# Patient Record
Sex: Female | Born: 1955
Health system: Southern US, Community
[De-identification: ages and names within clinical notes are randomized; demographics above are authoritative.]

## PROBLEM LIST (undated history)

## (undated) DIAGNOSIS — E785 Hyperlipidemia, unspecified: Secondary | ICD-10-CM

## (undated) DIAGNOSIS — K219 Gastro-esophageal reflux disease without esophagitis: Secondary | ICD-10-CM

## (undated) DIAGNOSIS — G5603 Carpal tunnel syndrome, bilateral upper limbs: Secondary | ICD-10-CM

## (undated) DIAGNOSIS — M1990 Unspecified osteoarthritis, unspecified site: Secondary | ICD-10-CM

## (undated) DIAGNOSIS — Z98811 Dental restoration status: Secondary | ICD-10-CM

## (undated) HISTORY — PX: TONSILLECTOMY AND ADENOIDECTOMY: SHX28

## (undated) HISTORY — DX: Gastro-esophageal reflux disease without esophagitis: K21.9

## (undated) HISTORY — DX: Hyperlipidemia, unspecified: E78.5

## (undated) HISTORY — DX: Unspecified osteoarthritis, unspecified site: M19.90

---

## 1995-11-13 HISTORY — PX: TUBAL LIGATION: SHX77

## 1999-10-09 ENCOUNTER — Other Ambulatory Visit: Admission: RE | Admit: 1999-10-09 | Discharge: 1999-10-09 | Payer: Self-pay | Admitting: *Deleted

## 2000-03-28 ENCOUNTER — Ambulatory Visit (HOSPITAL_COMMUNITY): Admission: RE | Admit: 2000-03-28 | Discharge: 2000-03-28 | Payer: Self-pay | Admitting: Gastroenterology

## 2001-03-20 ENCOUNTER — Other Ambulatory Visit: Admission: RE | Admit: 2001-03-20 | Discharge: 2001-03-20 | Payer: Self-pay | Admitting: Family Medicine

## 2001-07-26 ENCOUNTER — Emergency Department (HOSPITAL_COMMUNITY): Admission: EM | Admit: 2001-07-26 | Discharge: 2001-07-26 | Payer: Self-pay | Admitting: Emergency Medicine

## 2002-04-23 ENCOUNTER — Other Ambulatory Visit: Admission: RE | Admit: 2002-04-23 | Discharge: 2002-04-23 | Payer: Self-pay | Admitting: Obstetrics and Gynecology

## 2002-10-02 ENCOUNTER — Encounter: Payer: Self-pay | Admitting: Family Medicine

## 2002-10-02 ENCOUNTER — Encounter: Admission: RE | Admit: 2002-10-02 | Discharge: 2002-10-02 | Payer: Self-pay | Admitting: Family Medicine

## 2003-07-16 ENCOUNTER — Encounter: Admission: RE | Admit: 2003-07-16 | Discharge: 2003-07-16 | Payer: Self-pay | Admitting: Family Medicine

## 2003-07-16 ENCOUNTER — Encounter: Payer: Self-pay | Admitting: Family Medicine

## 2004-02-02 ENCOUNTER — Other Ambulatory Visit: Admission: RE | Admit: 2004-02-02 | Discharge: 2004-02-02 | Payer: Self-pay | Admitting: Obstetrics and Gynecology

## 2004-12-29 ENCOUNTER — Other Ambulatory Visit: Admission: RE | Admit: 2004-12-29 | Discharge: 2004-12-29 | Payer: Self-pay | Admitting: Obstetrics and Gynecology

## 2006-01-11 ENCOUNTER — Other Ambulatory Visit: Admission: RE | Admit: 2006-01-11 | Discharge: 2006-01-11 | Payer: Self-pay | Admitting: Obstetrics and Gynecology

## 2008-07-01 ENCOUNTER — Encounter: Admission: RE | Admit: 2008-07-01 | Discharge: 2008-07-01 | Payer: Self-pay | Admitting: Sports Medicine

## 2008-08-11 ENCOUNTER — Encounter: Admission: RE | Admit: 2008-08-11 | Discharge: 2008-08-11 | Payer: Self-pay | Admitting: Sports Medicine

## 2008-09-23 ENCOUNTER — Ambulatory Visit: Payer: Self-pay | Admitting: Internal Medicine

## 2008-10-21 ENCOUNTER — Encounter: Admission: RE | Admit: 2008-10-21 | Discharge: 2008-10-21 | Payer: Self-pay | Admitting: Sports Medicine

## 2008-12-03 ENCOUNTER — Ambulatory Visit: Payer: Self-pay | Admitting: Internal Medicine

## 2009-05-30 ENCOUNTER — Encounter: Admission: RE | Admit: 2009-05-30 | Discharge: 2009-05-30 | Payer: Self-pay | Admitting: Sports Medicine

## 2009-07-11 ENCOUNTER — Ambulatory Visit: Payer: Self-pay | Admitting: Internal Medicine

## 2009-07-19 ENCOUNTER — Ambulatory Visit: Payer: Self-pay | Admitting: Internal Medicine

## 2009-09-29 ENCOUNTER — Ambulatory Visit: Payer: Self-pay | Admitting: Internal Medicine

## 2009-12-20 ENCOUNTER — Ambulatory Visit: Payer: Self-pay | Admitting: Internal Medicine

## 2010-03-02 ENCOUNTER — Ambulatory Visit: Payer: Self-pay | Admitting: Family Medicine

## 2010-03-02 ENCOUNTER — Other Ambulatory Visit: Admission: RE | Admit: 2010-03-02 | Discharge: 2010-03-02 | Payer: Self-pay | Admitting: Family Medicine

## 2010-03-02 DIAGNOSIS — E785 Hyperlipidemia, unspecified: Secondary | ICD-10-CM | POA: Insufficient documentation

## 2010-03-02 DIAGNOSIS — R51 Headache: Secondary | ICD-10-CM | POA: Insufficient documentation

## 2010-03-02 DIAGNOSIS — R519 Headache, unspecified: Secondary | ICD-10-CM | POA: Insufficient documentation

## 2010-03-02 DIAGNOSIS — F17200 Nicotine dependence, unspecified, uncomplicated: Secondary | ICD-10-CM | POA: Insufficient documentation

## 2010-03-02 LAB — CONVERTED CEMR LAB
Ketones, urine, test strip: NEGATIVE
Nitrite: NEGATIVE
Specific Gravity, Urine: <1.005
WBC Urine, dipstick: NEGATIVE

## 2010-03-02 LAB — HM PAP SMEAR

## 2010-03-06 LAB — CONVERTED CEMR LAB
ALT: 12 units/L (ref 0–35)
AST: 18 units/L (ref 0–37)
Albumin: 4.3 g/dL (ref 3.5–5.2)
Basophils Absolute: 0 10*3/uL (ref 0.0–0.1)
Basophils Relative: 0.3 % (ref 0.0–3.0)
Eosinophils Relative: 2.3 % (ref 0.0–5.0)
GFR calc non Af Amer: 80.09 mL/min (ref >60)
Glucose, Bld: 80 mg/dL (ref 70–99)
HCT: 40.6 % (ref 36.0–46.0)
HDL: 61.8 mg/dL (ref >39.00)
Hemoglobin: 13.7 g/dL (ref 12.0–15.0)
Lymphs Abs: 2.4 10*3/uL (ref 0.7–4.0)
Monocytes Relative: 4.6 % (ref 3.0–12.0)
Neutro Abs: 6.9 10*3/uL (ref 1.4–7.7)
Potassium: 3.4 meq/L — ABNORMAL LOW (ref 3.5–5.1)
RBC: 4.42 M/uL (ref 3.87–5.11)
RDW: 13 % (ref 11.5–14.6)
Sodium: 143 meq/L (ref 135–145)
TSH: 0.8 microintl units/mL (ref 0.35–5.50)
Total Protein: 6.7 g/dL (ref 6.0–8.3)

## 2010-03-17 ENCOUNTER — Ambulatory Visit: Payer: Self-pay | Admitting: Family Medicine

## 2010-03-17 DIAGNOSIS — J309 Allergic rhinitis, unspecified: Secondary | ICD-10-CM | POA: Insufficient documentation

## 2010-03-30 ENCOUNTER — Ambulatory Visit: Payer: Self-pay | Admitting: Family Medicine

## 2010-07-31 ENCOUNTER — Telehealth: Payer: Self-pay | Admitting: Family Medicine

## 2010-11-12 HISTORY — PX: NASAL SEPTUM SURGERY: SHX37

## 2010-12-03 ENCOUNTER — Encounter: Payer: Self-pay | Admitting: Sports Medicine

## 2010-12-14 NOTE — Assessment & Plan Note (Signed)
Summary: BRAND NEW PT/TO EST/CJR   Vital Signs:  Patient profile:   55 year old female Menstrual status:  perimenopausal Height:      62 inches Weight:      136 pounds BMI:     24.96 Temp:     98.1 degrees F oral BP sitting:   110 / 70  (left arm) Cuff size:   regular  Vitals Entered By: Kern Reap CMA Duncan Dull) (March 02, 2010 10:43 AM) CC: new to establish Is Patient Diabetic? No Pain Assessment Patient in pain? no          Menstrual Status perimenopausal   CC:  new to establish.  History of Present Illness: Paige Wilson is a 55 year old, widowed female, smoker, G2, P38, with a 48 year old and 58 year old daughter, who is a Sales executive at the [employer] health department who comes in today as a new patient for a physical exam.  Her husband died a year and a half ago from complications of lung cancer.  He was a smoker.  She continues to smoke a pack of cigarettes a day.  She tried the chantix program, but only for 6 weeks.  Jomarie Longs is a history of hyperacidity, for which he takes protonic 20 mg daily.  She also is allergic rhinitis, for which she takes Zyrtec 10 mg nightly  She has mild sleep dysfunction.  She takes Klonopin one fourth of the 0.5-mg tablet nightly.  LMP September 2010.  She's had a BTL.  Review of systems she wears contacts for distance vision, she gets regular dental care, has a history of hyperlipidemia was put on Lipitor as she stopped the medication because of musculoskeletal side effects.  She's had a colonoscopy, which was normal two children as noted above.  Rest review of systems negative.  Family history unknown.  She was adopted.  Tetanus 2009.  Preventive Screening-Counseling & Management  Alcohol-Tobacco     Smoking Status: current     Packs/Day: 1.0      Drug Use:  no.    Allergies (verified): 1)  ! Bactrim 2)  ! Sulfa 3)  ! * Dust 4)  ! * Mold  Past History:  Past medical, surgical, family and social histories (including risk  factors) reviewed, and no changes noted (except as noted below).  Past Medical History: Headache arthritis Hyperlipidemia  Family History: Reviewed history and no changes required. Father: unkown adopted Mother:  Siblings:   Social History: Reviewed history and no changes required. Occupation: health department Single Current Smoker Alcohol use-no Drug use-no Packs/Day:  1.0 Smoking Status:  current Drug Use:  no  Review of Systems      See HPI  Physical Exam  General:  Well-developed,well-nourished,in no acute distress; alert,appropriate and cooperative throughout examination Head:  Normocephalic and atraumatic without obvious abnormalities. No apparent alopecia or balding. Eyes:  No corneal or conjunctival inflammation noted. EOMI. Perrla. Funduscopic exam benign, without hemorrhages, exudates or papilledema. Vision grossly normal. Ears:  External ear exam shows no significant lesions or deformities.  Otoscopic examination reveals clear canals, tympanic membranes are intact bilaterally without bulging, retraction, inflammation or discharge. Hearing is grossly normal bilaterally. Nose:  External nasal examination shows no deformity or inflammation. Nasal mucosa are pink and moist without lesions or exudates. Mouth:  Oral mucosa and oropharynx without lesions or exudates.  Teeth in good repair. Neck:  No deformities, masses, or tenderness noted. Chest Wall:  No deformities, masses, or tenderness noted. Breasts:  No mass, nodules, thickening, tenderness, bulging,  retraction, inflamation, nipple discharge or skin changes noted.   Lungs:  Normal respiratory effort, chest expands symmetrically. Lungs are clear to auscultation, no crackles or wheezes. Heart:  Normal rate and regular rhythm. S1 and S2 normal without gallop, murmur, click, rub or other extra sounds. Abdomen:  Bowel sounds positive,abdomen soft and non-tender without masses, organomegaly or hernias noted. Rectal:  No  external abnormalities noted. Normal sphincter tone. No rectal masses or tenderness. Genitalia:  Pelvic Exam:        External: normal female genitalia without lesions or masses        Vagina: normal without lesions or masses        Cervix: normal without lesions or masses        Adnexa: normal bimanual exam without masses or fullness        Uterus: normal by palpation        Pap smear: performed Msk:  No deformity or scoliosis noted of thoracic or lumbar spine.   Pulses:  R and L carotid,radial,femoral,dorsalis pedis and posterior tibial pulses are full and equal bilaterally Extremities:  No clubbing, cyanosis, edema, or deformity noted with normal full range of motion of all joints.   Neurologic:  No cranial nerve deficits noted. Station and gait are normal. Plantar reflexes are down-going bilaterally. DTRs are symmetrical throughout. Sensory, motor and coordinative functions appear intact. Skin:  Intact without suspicious lesions or rashes Cervical Nodes:  No lymphadenopathy noted Axillary Nodes:  No palpable lymphadenopathy Inguinal Nodes:  No significant adenopathy Psych:  Cognition and judgment appear intact. Alert and cooperative with normal attention span and concentration. No apparent delusions, illusions, hallucinations   Problems:  Medical Problems Added: 1)  Dx of Preventive Health Care  (ICD-V70.0) 2)  Dx of Tobacco Use  (ICD-305.1) 3)  Dx of Hyperlipidemia  (ICD-272.4) 4)  Dx of Headache  (ICD-784.0)  Impression & Recommendations:  Problem # 1:  TOBACCO USE (ICD-305.1) Assessment New  Her updated medication list for this problem includes:    Chantix Starting Month Pak 0.5 Mg X 11 & 1 Mg X 42 Tabs (Varenicline tartrate) ..... Uad    Chantix Continuing Month Pak 1 Mg Tabs (Varenicline tartrate) ..... Uad  Orders: Venipuncture (16109) TLB-Lipid Panel (80061-LIPID) TLB-BMP (Basic Metabolic Panel-BMET) (80048-METABOL) TLB-CBC Platelet - w/Differential  (85025-CBCD) TLB-Hepatic/Liver Function Pnl (80076-HEPATIC) TLB-TSH (Thyroid Stimulating Hormone) (84443-TSH) Tobacco use cessation intermediate 3-10 minutes (99406) UA Dipstick w/o Micro (automated)  (81003)  Problem # 2:  HYPERLIPIDEMIA (ICD-272.4) Assessment: New  Orders: Venipuncture (60454) TLB-Lipid Panel (80061-LIPID) TLB-BMP (Basic Metabolic Panel-BMET) (80048-METABOL) TLB-CBC Platelet - w/Differential (85025-CBCD) TLB-Hepatic/Liver Function Pnl (80076-HEPATIC) TLB-TSH (Thyroid Stimulating Hormone) (84443-TSH) Tobacco use cessation intermediate 3-10 minutes (99406) UA Dipstick w/o Micro (automated)  (81003) EKG w/ Interpretation (93000)  Problem # 3:  Preventive Health Care (ICD-V70.0) Assessment: New  Orders: Venipuncture (09811) TLB-Lipid Panel (80061-LIPID) TLB-BMP (Basic Metabolic Panel-BMET) (80048-METABOL) TLB-CBC Platelet - w/Differential (85025-CBCD) TLB-Hepatic/Liver Function Pnl (80076-HEPATIC) TLB-TSH (Thyroid Stimulating Hormone) (84443-TSH) Tobacco use cessation intermediate 3-10 minutes (99406) UA Dipstick w/o Micro (automated)  (81003) EKG w/ Interpretation (93000)  Complete Medication List: 1)  Vitamin D 1000 Unit Tabs (Cholecalciferol) .... Once daily 2)  Calcium 1500 Mg Tabs (Calcium carbonate) .... Once daily 3)  Protonix 40 Mg Tbec (Pantoprazole sodium) .... Take one tab by mouth once daily 4)  Zyrtec Hives Relief 10 Mg Tabs (Cetirizine hcl) .... Once daily 5)  Klonopin 0.5 Mg Tabs (Clonazepam) .... Take one tab by mouth once daily 6)  Chantix  Starting Month Pak 0.5 Mg X 11 & 1 Mg X 42 Tabs (Varenicline tartrate) .... Uad 7)  Chantix Continuing Month Pak 1 Mg Tabs (Varenicline tartrate) .... Uad  Patient Instructions: 1)  began  the  chantix program to return in one month for follow-up. 2)  Please schedule a follow-up appointment in 1 year. 3)  Schedule your mammogram. 4)  Take an Aspirin every day. Prescriptions: KLONOPIN 0.5 MG TABS  (CLONAZEPAM) take one tab by mouth once daily  #30 x 3   Entered and Authorized by:   Roderick Pee MD   Signed by:   Roderick Pee MD on 03/02/2010   Method used:   Print then Give to Patient   RxID:   [id number] PROTONIX 40 MG TBEC (PANTOPRAZOLE SODIUM) take one tab by mouth once daily  #100 x 3   Entered and Authorized by:   Roderick Pee MD   Signed by:   Roderick Pee MD on 03/02/2010   Method used:   Print then Give to Patient   RxID:   [id number] CHANTIX CONTINUING MONTH PAK 1 MG TABS (VARENICLINE TARTRATE) UAD  #1 x 3   Entered and Authorized by:   Roderick Pee MD   Signed by:   Roderick Pee MD on 03/02/2010   Method used:   Print then Give to Patient   RxID:   [id number] CHANTIX STARTING MONTH PAK 0.5 MG X 11 & 1 MG X 42 TABS (VARENICLINE TARTRATE) UAD  #1 x 0   Entered and Authorized by:   Roderick Pee MD   Signed by:   Roderick Pee MD on 03/02/2010   Method used:   Print then Give to Patient   RxID:   [id number]   Laboratory Results   Urine Tests  Date/Time Received: March 02, 2010   Routine Urinalysis   Color: yellow Appearance: Clear Glucose: negative   (Normal Range: Negative) Bilirubin: negative   (Normal Range: Negative) Ketone: negative   (Normal Range: Negative) Spec. Gravity: <1.005   (Normal Range: 1.003-1.035) Blood: small   (Normal Range: Negative) pH: 6.5   (Normal Range: 5.0-8.0) Protein: negative   (Normal Range: Negative) Urobilinogen: 0.2   (Normal Range: 0-1) Nitrite: negative   (Normal Range: Negative) Leukocyte Esterace: negative   (Normal Range: Negative)    Comments: Kern Reap CMA Duncan Dull)  March 02, 2010 11:47 AM

## 2010-12-14 NOTE — Assessment & Plan Note (Signed)
Summary: ? ear infection?/dm   Vital Signs:  Patient profile:   55 year old female Menstrual status:  perimenopausal Weight:      136 pounds Temp:     98.3 degrees F oral BP sitting:   118 / 80  (left arm) CC: Sinus pain in L ear, pressure   CC:  Sinus pain in L ear and pressure.  History of Present Illness: Paige Wilson is a 55 year old, widowed female, smoker, who comes in today for evaluation of head congestion, postnasal drip nonproductive cough, and a left ear ache.  We saw her a while back for general physical exam.  At that time was put on a smoking cessation program with chantix.  She has not purchased the medication.  Review of systems negative  Allergies: 1)  ! Bactrim 2)  ! Sulfa 3)  ! * Dust 4)  ! * Mold  Past History:  Past medical, surgical, family and social histories (including risk factors) reviewed for relevance to current acute and chronic problems.  Past Medical History: Reviewed history from 03/02/2010 and no changes required. Headache arthritis Hyperlipidemia  Family History: Reviewed history from 03/02/2010 and no changes required. Father: unkown adopted Mother:  Siblings:   Social History: Reviewed history from 03/02/2010 and no changes required. Occupation: health department Single Current Smoker Alcohol use-no Drug use-no  Review of Systems      See HPI  Physical Exam  General:  Well-developed,well-nourished,in no acute distress; alert,appropriate and cooperative throughout examination Head:  Normocephalic and atraumatic without obvious abnormalities. No apparent alopecia or balding. Eyes:  No corneal or conjunctival inflammation noted. EOMI. Perrla. Funduscopic exam benign, without hemorrhages, exudates or papilledema. Vision grossly normal. Ears:  External ear exam shows no significant lesions or deformities.  Otoscopic examination reveals clear canals, tympanic membranes are intact bilaterally without bulging, retraction, inflammation  or discharge. Hearing is grossly normal bilaterally. Nose:  External nasal examination shows no deformity or inflammation. Nasal mucosa are pink and moist without lesions or exudates. Mouth:  Oral mucosa and oropharynx without lesions or exudates.  Teeth in good repair. Neck:  No deformities, masses, or tenderness noted. Chest Wall:  No deformities, masses, or tenderness noted. Lungs:  Normal respiratory effort, chest expands symmetrically. Lungs are clear to auscultation, no crackles or wheezes.   Problems:  Medical Problems Added: 1)  Dx of Rhinitis  (ICD-477.9)  Impression & Recommendations:  Problem # 1:  TOBACCO USE (ICD-305.1) Assessment Unchanged  Her updated medication list for this problem includes:    Chantix Starting Month Pak 0.5 Mg X 11 & 1 Mg X 42 Tabs (Varenicline tartrate) ..... Uad    Chantix Continuing Month Pak 1 Mg Tabs (Varenicline tartrate) ..... Uad  Orders: Tobacco use cessation intermediate 3-10 minutes (16109)  Problem # 2:  RHINITIS (ICD-477.9) Assessment: New  Her updated medication list for this problem includes:    Zyrtec Hives Relief 10 Mg Tabs (Cetirizine hcl) ..... Once daily    Flonase 50 Mcg/act Susp (Fluticasone propionate) ..... Uad  Orders: Tobacco use cessation intermediate 3-10 minutes (99406)  Complete Medication List: 1)  Vitamin D 1000 Unit Tabs (Cholecalciferol) .... Once daily 2)  Calcium 1500 Mg Tabs (Calcium carbonate) .... Once daily 3)  Protonix 40 Mg Tbec (Pantoprazole sodium) .... Take one tab by mouth once daily 4)  Zyrtec Hives Relief 10 Mg Tabs (Cetirizine hcl) .... Once daily 5)  Klonopin 0.5 Mg Tabs (Clonazepam) .... Take one tab by mouth once daily 6)  Chantix Starting Month  Pak 0.5 Mg X 11 & 1 Mg X 42 Tabs (Varenicline tartrate) .... Uad 7)  Chantix Continuing Month Pak 1 Mg Tabs (Varenicline tartrate) .... Uad 8)  Flonase 50 Mcg/act Susp (Fluticasone propionate) .... Uad  Patient Instructions: 1)  begin the  chantix and stop smoking completely. 2)  At bedtime use afrin nasal spray,,,,,,,, followed by the saline nasal spray,,,,,,,,, followed by the steroid nasal spray.  After 5 nights stop the Afrin, but continue the saline nasal spray in the steroid nasal spray. 3)  Take 10 mg of plain  Zyrtec at bedtime.  Return p.r.n. Prescriptions: FLONASE 50 MCG/ACT SUSP (FLUTICASONE PROPIONATE) UAD  #1 x 5   Entered and Authorized by:   Roderick Pee MD   Signed by:   Roderick Pee MD on 03/17/2010   Method used:   Print then Give to Patient   RxID:   [id number]

## 2010-12-14 NOTE — Progress Notes (Signed)
Summary: Pt req new script for Klonopin. Pls call in to [facility]  Phone Note Refill Request Call back at [phone] cell   Refills Requested: Medication #1:  KLONOPIN 0.5 MG TABS take one tab by mouth once daily   Dosage confirmed as above?Dosage Confirmed Pt has misplaced written script for Klonopin. Pt is req this be called in to [facility] on [address]   Method Requested: Telephone to Pharmacy Initial call taken by: Lucy Antigua,  July 31, 2010 11:00 AM  Follow-up for Phone Call        Last refill in our system was 03-02-10 for 30 tabs with 3 refills  Last office visit was 03-30-10 Follow-up by: Kathrynn Speed CMA,  July 31, 2010 11:42 AM  Additional Follow-up for Phone Call Additional follow up Details #1::        Klonopin .5 mg, dispensed 30 tabs directions one nightly p.r.n. refills x 4.......... CPX April 2012 Additional Follow-up by: Roderick Pee MD,  July 31, 2010 2:29 PM    Prescriptions: KLONOPIN 0.5 MG TABS (CLONAZEPAM) take one tab by mouth once daily  #30 x 4   Entered by:   Kathrynn Speed CMA   Authorized by:   Roderick Pee MD   Signed by:   Kathrynn Speed CMA on 08/01/2010   Method used:   Historical   RxID:   [id number]

## 2010-12-14 NOTE — Assessment & Plan Note (Signed)
Summary: 1 month rov/njr pt rsc appt time/njr   Vital Signs:  Patient profile:   55 year old female Menstrual status:  perimenopausal Weight:      137 pounds Temp:     98.0 degrees F oral BP sitting:   108 / 80  (left arm) Cuff size:   regular  Vitals Entered By: Kern Reap CMA Duncan Dull) (Mar 30, 2010 11:26 AM) CC: follow-up visit   CC:  follow-up visit.  History of Present Illness: Paige Wilson is a 55 year old female, who comes in today to discuss tobacco abuse, and hyperlipidemia.  She purchased the chantix and took her first pill Monday.  She is on her way.  I encouraged her to continue  She does have hyperlipidemia, however, she has a very high HDL.  Genetic history unknown because she was adopted.  With no other risk factors I would not recommend she take a lipid lowering drug because she is going to stop smoking  Allergies: 1)  ! Bactrim 2)  ! Sulfa 3)  ! * Dust 4)  ! * Mold  Family History: Reviewed history from 03/02/2010 and no changes required. Father: unkown adopted Mother:  Siblings:   Review of Systems      See HPI  Physical Exam  General:  Well-developed,well-nourished,in no acute distress; alert,appropriate and cooperative throughout examination   Impression & Recommendations:  Problem # 1:  TOBACCO USE (ICD-305.1) Assessment Improved  Her updated medication list for this problem includes:    Chantix Starting Month Pak 0.5 Mg X 11 & 1 Mg X 42 Tabs (Varenicline tartrate) ..... Uad    Chantix Continuing Month Pak 1 Mg Tabs (Varenicline tartrate) ..... Uad  Complete Medication List: 1)  Vitamin D 1000 Unit Tabs (Cholecalciferol) .... Once daily 2)  Calcium 1500 Mg Tabs (Calcium carbonate) .... Once daily 3)  Protonix 40 Mg Tbec (Pantoprazole sodium) .... Take one tab by mouth once daily 4)  Zyrtec Hives Relief 10 Mg Tabs (Cetirizine hcl) .... Once daily 5)  Klonopin 0.5 Mg Tabs (Clonazepam) .... Take one tab by mouth once daily 6)  Chantix Starting  Month Pak 0.5 Mg X 11 & 1 Mg X 42 Tabs (Varenicline tartrate) .... Uad 7)  Chantix Continuing Month Pak 1 Mg Tabs (Varenicline tartrate) .... Uad 8)  Flonase 50 Mcg/act Susp (Fluticasone propionate) .... Uad  Patient Instructions: 1)  continue the program with outlined.  Return in 6 weeks for follow-up

## 2011-03-02 ENCOUNTER — Other Ambulatory Visit (INDEPENDENT_AMBULATORY_CARE_PROVIDER_SITE_OTHER): Payer: 59 | Admitting: Family Medicine

## 2011-03-02 DIAGNOSIS — Z0000 Encounter for general adult medical examination without abnormal findings: Secondary | ICD-10-CM

## 2011-03-02 DIAGNOSIS — E785 Hyperlipidemia, unspecified: Secondary | ICD-10-CM

## 2011-03-02 LAB — BASIC METABOLIC PANEL
BUN: 13 mg/dL (ref 6–23)
CO2: 28 mEq/L (ref 19–32)
Calcium: 10 mg/dL (ref 8.4–10.5)
Creatinine, Ser: 0.9 mg/dL (ref 0.4–1.2)
GFR: 73.39 mL/min (ref >60.00)
Glucose, Bld: 79 mg/dL (ref 70–99)

## 2011-03-02 LAB — CBC WITH DIFFERENTIAL/PLATELET
Basophils Absolute: 0 10*3/uL (ref 0.0–0.1)
Eosinophils Relative: 1.2 % (ref 0.0–5.0)
HCT: 40.5 % (ref 36.0–46.0)
Hemoglobin: 13.8 g/dL (ref 12.0–15.0)
Lymphocytes Relative: 18.7 % (ref 12.0–46.0)
Lymphs Abs: 1.6 10*3/uL (ref 0.7–4.0)
Monocytes Relative: 5 % (ref 3.0–12.0)
Neutro Abs: 6.6 10*3/uL (ref 1.4–7.7)
Platelets: 322 10*3/uL (ref 150.0–400.0)
WBC: 8.8 10*3/uL (ref 4.5–10.5)

## 2011-03-02 LAB — POCT URINALYSIS DIPSTICK
Ketones, UA: NEGATIVE
Protein, UA: NEGATIVE
Spec Grav, UA: 1.01
Urobilinogen, UA: 0.2
pH, UA: 7

## 2011-03-02 LAB — LIPID PANEL
Cholesterol: 271 mg/dL — ABNORMAL HIGH (ref 0–200)
HDL: 64 mg/dL (ref >39.00)
Total CHOL/HDL Ratio: 4
VLDL: 29.2 mg/dL (ref 0.0–40.0)

## 2011-03-02 LAB — HEPATIC FUNCTION PANEL
Albumin: 4 g/dL (ref 3.5–5.2)
Total Protein: 6.6 g/dL (ref 6.0–8.3)

## 2011-03-02 LAB — LDL CHOLESTEROL, DIRECT: Direct LDL: 181 mg/dL

## 2011-03-09 ENCOUNTER — Encounter: Payer: Self-pay | Admitting: Family Medicine

## 2011-03-12 ENCOUNTER — Ambulatory Visit (INDEPENDENT_AMBULATORY_CARE_PROVIDER_SITE_OTHER): Payer: 59 | Admitting: Family Medicine

## 2011-03-12 ENCOUNTER — Encounter: Payer: Self-pay | Admitting: Family Medicine

## 2011-03-12 ENCOUNTER — Other Ambulatory Visit (HOSPITAL_COMMUNITY)
Admission: RE | Admit: 2011-03-12 | Discharge: 2011-03-12 | Disposition: A | Discharge status: Home / Self Care | Payer: 59 | Source: Ambulatory Visit | Attending: Family Medicine | Admitting: Family Medicine

## 2011-03-12 DIAGNOSIS — G8929 Other chronic pain: Secondary | ICD-10-CM | POA: Insufficient documentation

## 2011-03-12 DIAGNOSIS — Z01419 Encounter for gynecological examination (general) (routine) without abnormal findings: Secondary | ICD-10-CM | POA: Insufficient documentation

## 2011-03-12 DIAGNOSIS — F329 Major depressive disorder, single episode, unspecified: Secondary | ICD-10-CM

## 2011-03-12 DIAGNOSIS — R1011 Right upper quadrant pain: Secondary | ICD-10-CM

## 2011-03-12 DIAGNOSIS — Z0000 Encounter for general adult medical examination without abnormal findings: Secondary | ICD-10-CM

## 2011-03-12 DIAGNOSIS — Z23 Encounter for immunization: Secondary | ICD-10-CM

## 2011-03-12 DIAGNOSIS — J301 Allergic rhinitis due to pollen: Secondary | ICD-10-CM

## 2011-03-12 DIAGNOSIS — F3289 Other specified depressive episodes: Secondary | ICD-10-CM

## 2011-03-12 DIAGNOSIS — F17200 Nicotine dependence, unspecified, uncomplicated: Secondary | ICD-10-CM

## 2011-03-12 MED ORDER — VARENICLINE TARTRATE 1 MG PO TABS: ORAL_TABLET | ORAL | Status: DC

## 2011-03-12 MED ORDER — FLUTICASONE PROPIONATE 50 MCG/ACT NA SUSP: 2.0000 | Freq: Every day | NASAL | Status: DC

## 2011-03-12 MED ORDER — PANTOPRAZOLE SODIUM 40 MG PO TBEC: 40.0000 mg | DELAYED_RELEASE_TABLET | Freq: Every day | ORAL | Status: DC

## 2011-03-12 NOTE — Patient Instructions (Signed)
We will set you up and ultrasound of her gallbladder.  Return in two to 3 days after his study to review the results.  Restart the chantix program one half tab daily.  Stay on a complete fat free diet.  Walk 15 minutes daily.  Motrin 600 mg twice daily for her arthritis.  Once we get U. Off the cigarettes and then we can discuss starting you on hormone replacement therapy

## 2011-03-12 NOTE — Progress Notes (Signed)
Subjective:    Patient ID: Paige Wilson, female    DOB: 02/10/1956, 55 y.o.   MRN: [id number]  HPI  Paige Wilson Is a 55 year old widowed female, who comes in today for annual physical examination and a right he wished in the multiple issues.  Her last menstrual period was 14 months ago.  She is having trouble with menopausal symptoms.  Dry skin hot flashes, mood changes.  She also continues to smoke.  We started on the chantix program and got her down on one chantix tablet a day to one cigarette a day.  Now.  She is back to 20 cigarettes a day and off.  The chantix.  She also has arthritis, mostly involving her hands and her back.  She also has burning midepigastric and right upper quadrant.  This sometimes radiates up into her back when she eats certain foods.  She also has symptoms of bloating, PC.  She gets routine eye care, dental care, does not check her breasts monthly, last mammogram a year and a half ago, colonoscopy, normal.  Tetanus unknown.  Booster today.  She  She takes calcium and vitamin D for some mild osteopenia.  An aspirin tablet daily, steroid nasal spray over-the-counter Claritin, protonic daily for reflux esophagitis.    Review of Systems  Constitutional: Negative.   HENT: Negative.   Eyes: Negative.   Respiratory: Negative.   Cardiovascular: Negative.   Gastrointestinal: Positive for abdominal pain and abdominal distention.  Genitourinary: Negative.   Musculoskeletal: Positive for back pain.  Neurological: Negative.   Hematological: Negative.   Psychiatric/Behavioral: Positive for dysphoric mood.       Objective:   Physical Exam  Constitutional: She is oriented to person, place, and time. She appears well-developed and well-nourished.  HENT:  Head: Normocephalic and atraumatic.  Right Ear: External ear normal.  Left Ear: External ear normal.  Nose: Nose normal.  Mouth/Throat: Oropharynx is clear and moist.  Eyes: EOM are normal. Pupils are equal, round,  and reactive to light.  Neck: Normal range of motion. Neck supple. No JVD present. No tracheal deviation present. No thyromegaly present.  Cardiovascular: Normal rate, regular rhythm, normal heart sounds and intact distal pulses.  Exam reveals no gallop and no friction rub.   No murmur heard. Pulmonary/Chest: Effort normal and breath sounds normal. No stridor. No respiratory distress. She has no wheezes. She has no rales. She exhibits no tenderness.  Abdominal: Soft. Bowel sounds are normal. She exhibits no distension and no mass. There is no tenderness. There is no rebound and no guarding.  Genitourinary: Vagina normal and uterus normal. Guaiac negative stool. No vaginal discharge found.       Bilateral breast exam normal  Musculoskeletal: Normal range of motion. She exhibits no edema and no tenderness.  Lymphadenopathy:    She has no cervical adenopathy.  Neurological: She is alert and oriented to person, place, and time. She has normal reflexes. She displays normal reflexes. No cranial nerve deficit. She exhibits normal muscle tone. Coordination normal.  Skin: Skin is warm and dry.  Psychiatric: She has a normal mood and affect. Her behavior is normal. Judgment and thought content normal.          Assessment & Plan:  Postmenopausal symptoms of skin changes, dry.  Vagina, hot flashes, mood changes,,,,,,,,,,, discussed HRT only if she stops smoking.  Abdominal pain.  P.c., rule out gallbladder disease.  Set up ultrasound.  Tobacco abuse, restart the chantix program one half tab daily.  Osteoarthritis.  Motrin, 600 mg b.i.d., p.r.n.  Osteopenia.  Continue calcium, vitamin D, and walking daily.  Reflux continue OTC Prilosec daily or protonic 40 daily.

## 2011-03-15 ENCOUNTER — Other Ambulatory Visit: Payer: Self-pay | Admitting: Family Medicine

## 2011-03-15 ENCOUNTER — Encounter: Payer: Self-pay | Admitting: Family Medicine

## 2011-03-15 DIAGNOSIS — R1011 Right upper quadrant pain: Secondary | ICD-10-CM

## 2011-03-19 ENCOUNTER — Ambulatory Visit: Payer: 59 | Admitting: Family Medicine

## 2011-03-19 ENCOUNTER — Other Ambulatory Visit: Payer: 59

## 2011-03-23 ENCOUNTER — Ambulatory Visit
Admission: RE | Admit: 2011-03-23 | Discharge: 2011-03-23 | Disposition: A | Discharge status: Home / Self Care | Payer: 59 | Source: Ambulatory Visit | Attending: Family Medicine | Admitting: Family Medicine

## 2011-03-23 DIAGNOSIS — R1011 Right upper quadrant pain: Secondary | ICD-10-CM

## 2011-03-30 NOTE — Procedures (Signed)
[place]  Patient:    Paige Wilson, Paige Wilson                       MRN: [id number] Proc. Date: 03/26/00 Adm. Date:  [id number] Disc. Date: [id number] Attending:  Charna Elizabeth                           Procedure Report  DATE OF BIRTH:  February 17, 2056  PROCEDURE:  Colonoscopy.  ENDOSCOPIST:  Anselmo Rod, M.D.  INSTRUMENT USED:  Olympus video colonoscope.  INDICATIONS:  A 55 year old white female with a history of rectal bleeding, rule out colon polyps, masses, hemorrhoids, etc.  PREPROCEDURE PREPARATION:  Informed consent was procured from the patient. The patient was fasted for eight hours prior to the procedure.  PREPROCEDURE PHYSICAL EXAMINATION:  The patient had stable vital signs.  Neck supple.  Chest clear to auscultation.  S1 and S2 regular.  Abdomen soft with normal abdominal bowel sounds.  DESCRIPTION OF PROCEDURE:  The patient was placed in left lateral decubitus position and sedated with 20 mg of Demerol and 9 mg of Versed intravenously. The patient had some itching over her arm after the infusion of Demerol and therefore no more than 20 mg of Demerol were given.  Once the patient was adequately sedated with an additional 9 mg of Versed, the Olympus video colonoscope was advanced from the rectum to the cecum with slight difficulty secondary to a very short tortuous colon.  No masses, polyps, erosions, or ulcerations were seen.  There were small internal hemorrhoids appreciated on retroflexion in the rectum.  The patient tolerated the procedure well without complication.  IMPRESSION: 1. Tortuous colon, essentially unrevealing colonoscopy. 2. Small nonbleeding internal hemorrhoids seen.  RECOMMENDATIONS:  The patient has been advised to increase the fluid and fiber in her diet and follow up in the office in the next two weeks.DD:  03/29/00 TD:  04/02/00 Job: [id number]

## 2011-04-05 ENCOUNTER — Encounter: Payer: Self-pay | Admitting: Family Medicine

## 2011-04-05 ENCOUNTER — Ambulatory Visit (INDEPENDENT_AMBULATORY_CARE_PROVIDER_SITE_OTHER): Payer: 59 | Admitting: Family Medicine

## 2011-04-05 DIAGNOSIS — R1011 Right upper quadrant pain: Secondary | ICD-10-CM

## 2011-04-05 NOTE — Patient Instructions (Signed)
Stay and a complete fat-free diet also.  No evidence  We will set up a HIDA scan for May, the 31st  Return the Monday after your scan for follow-up

## 2011-04-05 NOTE — Progress Notes (Signed)
  Subjective:    Patient ID: Paige Wilson, female    DOB: December 08, 1955, 55 y.o.   MRN: [id number]  HPI Paige Wilson is a 55 year old single female comes in today for reevaluation of abdominal pain.  We saw her 10 days ago for abdominal pain.  It was right upper quadrant.  PC.  We put her on a complete.  No fat diet and on ultrasound of her gallbladder.  Gallbladder ultrasound looks normal.  No stones.  No thickening.  However, she continues to have PCU down.  No pain.  A couple days ago.  She ate a salad, and that of a lot of discomfort.  She continues to deny any fever, chills, weight loss, nausea, vomiting, or diarrhea.  She also states she continues to feel a little bloated.   Review of Systems In general, and GI review of systems otherwise negative    Objective:   Physical Exam    Well-developed well-nourished, female, in no acute distress.  Examination of the abdomen was negative, except for some tenderness to deep palpation.  Right upper quadrant    Assessment & Plan:  Right upper quadrant abdominal pain, p.c.,,,,,,,,, still suspicious of gallbladder disease,,,,,,,, however always keep in mind, ovarian cancer,,,,,,,,,,, HIDA scan to rule out gallbladder disease,,,,,,,,,,,, if negative, CT scan to rule out ovarian cancer

## 2011-05-07 ENCOUNTER — Encounter (HOSPITAL_COMMUNITY)
Admission: RE | Admit: 2011-05-07 | Discharge: 2011-05-07 | Disposition: A | Discharge status: Home / Self Care | Payer: 59 | Source: Ambulatory Visit | Attending: Family Medicine | Admitting: Family Medicine

## 2011-05-07 DIAGNOSIS — R932 Abnormal findings on diagnostic imaging of liver and biliary tract: Secondary | ICD-10-CM | POA: Insufficient documentation

## 2011-05-07 DIAGNOSIS — R1011 Right upper quadrant pain: Secondary | ICD-10-CM | POA: Insufficient documentation

## 2011-05-07 MED ORDER — TECHNETIUM TC 99M MEBROFENIN IV KIT
5.0000 | PACK | Freq: Once | INTRAVENOUS | Status: AC | PRN
  Administered 2011-05-07: 5 via INTRAVENOUS

## 2011-05-08 NOTE — Progress Notes (Signed)
patient  Is aware 

## 2011-05-10 ENCOUNTER — Ambulatory Visit: Payer: 59 | Admitting: Family Medicine

## 2011-08-27 ENCOUNTER — Encounter (INDEPENDENT_AMBULATORY_CARE_PROVIDER_SITE_OTHER): Payer: Self-pay | Admitting: General Surgery

## 2011-08-27 ENCOUNTER — Ambulatory Visit (INDEPENDENT_AMBULATORY_CARE_PROVIDER_SITE_OTHER): Payer: 59 | Admitting: General Surgery

## 2011-08-27 VITALS — BP 108/64 | HR 68 | Temp 97.1°F | Resp 14 | Ht 62.0 in | Wt 151.2 lb

## 2011-08-27 DIAGNOSIS — R1031 Right lower quadrant pain: Secondary | ICD-10-CM

## 2011-08-27 NOTE — Progress Notes (Signed)
Chief Complaint  Patient presents with  . Other    Eval gallbladder - low function    HPI Paige Wilson Seen is a 55 y.o. female. Referred by Dr. Alonza Smoker HPI 58 yof with about a one year history of abdominal bloating when eats certain foods esp salad that is also associated with some right sided abdominal pain occasionally.  The bloating has worsened while the pain has remained about the same.  The pain occurs frequently and she points to her rlq today when describing it.  There is no associated nausea or vomiting. She has no history of jaundice.  She has no change in her bowel habits and has a history of constipation.  She has undergone a normal ultrasound and a HIDA scan with decreased EF but no reproduction of her symptoms.  She has a prior colonoscopy by Dr. Loreta Ave for which she reports was normal.  Nothing really relieves the pain except time and eating makes it worse.  She has a history of GERD for which she takes protonix that causes her epigastric pain without meds that she states is separate from this.  Past Medical History  Diagnosis Date  . Hyperlipidemia   . Arthritis   . Anemia   . GERD (gastroesophageal reflux disease)     Past Surgical History  Procedure Date  . Tonsilectomy, adenoidectomy, bilateral myringotomy and tubes 1975  . Tubal ligation 1997  . Nasal septum surgery 2011    Family History  Problem Relation Age of Onset  . Adopted: Yes    Social History History  Substance Use Topics  . Smoking status: Current Everyday Smoker -- 1.0 packs/day    Types: Cigarettes  . Smokeless tobacco: Never Used  . Alcohol Use: No    Allergies  Allergen Reactions  . Sulfamethoxazole W/Trimethoprim   . Sulfonamide Derivatives   . Bactrim Hives    Current Outpatient Prescriptions  Medication Sig Dispense Refill  . aspirin 81 MG tablet Take 81 mg by mouth daily.        . calcium-vitamin D 250-100 MG-UNIT per tablet Take 1 tablet by mouth daily.        . fluticasone  (FLONASE) 50 MCG/ACT nasal spray 2 sprays by Nasal route daily.  16 g  11  . loratadine (CLARITIN) 10 MG tablet Take 10 mg by mouth daily.        . pantoprazole (PROTONIX) 40 MG tablet Take 1 tablet (40 mg total) by mouth daily.  100 tablet  3  . varenicline (CHANTIX) 1 MG tablet One p.o. B.i.d.  60 tablet  3    Review of Systems Review of Systems  HENT: Positive for congestion.   Respiratory: Positive for cough.   Gastrointestinal: Positive for abdominal pain, constipation and abdominal distention.  Genitourinary: Positive for hematuria.  Musculoskeletal: Positive for arthralgias.    Blood pressure 108/64, pulse 68, temperature 97.1 F (36.2 C), temperature source Temporal, resp. rate 14, height 5\' 2"  (1.575 m), weight 151 lb 3.2 oz (68.584 kg).  Physical Exam Physical Exam  Constitutional: She appears well-developed and well-nourished.  Eyes: No scleral icterus.  Neck: Neck supple.  Cardiovascular: Normal rate, regular rhythm and normal heart sounds.   Pulmonary/Chest: Effort normal and breath sounds normal. She has no wheezes. She has no rales.  Abdominal: Soft. Normal appearance and bowel sounds are normal. There is no hepatosplenomegaly. There is tenderness in the right lower quadrant. There is no CVA tenderness and negative Murphy's sign. No hernia. Hernia  confirmed negative in the ventral area, confirmed negative in the right inguinal area and confirmed negative in the left inguinal area.    Lymphadenopathy:    She has no cervical adenopathy.    Data Reviewed NUCLEAR MEDICINE HEPATOBILIARY IMAGING WITH GALLBLADDER EF  Technique: Sequential images of the abdomen were obtained out to  60 minutes following intravenous administration of  radiopharmaceutical. After slow intravenous infusion of 1.4  micrograms Cholecystokinin, gallbladder ejection fraction was  determined.  Radiopharmaceutical: 5.0 mCi Tc-55m Choletec  Comparison: Abdominal ultrasound 03/23/2011.  Findings:  Good radiotracer uptake by the liver and clearance of the  blood pool. Prominent superior margin of the right lobes suggests  elevation of the right hemidiaphragm.  Prompt appearance of the gallbladder by 15 minutes. Small bowel  activity by 50 minutes.  Gallbladder ejection fraction measured following CCK infusion at  13.2% over 30 minutes. Normal gallbladder ejection fraction is  considered to be greater than 35% at 30 minutes (Ziessman et al.,  1992 J. Nuclear Med).  The patient did not experience symptoms during CCK infusion.  IMPRESSION:  1. Normal liver uptake and prompt uptake of radiotracer into the  gallbladder.  2. Abnormally low gallbladder ejection fraction, 13% (normal  ejection fraction greater than 35%).  COMPLETE ABDOMINAL ULTRASOUND  Comparison: None.  Findings:  Gallbladder: No gallstones, gallbladder wall thickening, or  pericholecystic fluid. No sonographic Murphy's sign elicited.  Common bile duct: Normal measuring up to 6 mm in diameter.  Liver: No focal lesion identified. Within normal limits in  parenchymal echogenicity.  IVC: Appears normal.  Pancreas: No focal abnormality seen.  Spleen: Normal measuring 2.9 cm in length.  Right Kidney: No hydronephrosis. Renal cortical echotexture and  corticomedullary differentiation are normal except for the vague  echogenic area in the lower pole measuring 7 x 5 x 7 mm. No  shadowing. No associated hypervascularity. Overall renal length  9.7 cm.  Left Kidney: Normal measuring 10.5 cm.  Abdominal aorta: Normal with visualized maximum diameter 2.0 cm.  IMPRESSION:  1. Normal liver and gallbladder.  2. Questionable subcentimeter hyperechoic lesion at the right  renal lower pole. Small angiomyolipoma cannot be excluded, but  regardless clinical significance is doubtful.  Assessment    Abdominal pain    Plan    I'm not sure of the source of her pain. It may be her gallbladder but her u/s is normal and HIDA scan  didn't reproduce pain.  She is tender and points to her RLQ today as source of her pain.  I think that the best plan would be a CT scan as mentioned in a prior note by Dr. Tawanna Cooler as well as possibly an evaluation of possible worsening GERD by Dr. Loreta Ave.  If these are both negative then I will have her come back and discuss cholecystectomy but am unsure of how many and to what degree this would help her current symptoms.  She was agreeable to this and I will follow up after her CT scan.  We did discuss lap chole as well as pathophysiology of gallbladder disease.       Janus Vlcek 08/27/2011, 6:39 PM

## 2011-08-31 ENCOUNTER — Ambulatory Visit
Admission: RE | Admit: 2011-08-31 | Discharge: 2011-08-31 | Disposition: A | Discharge status: Home / Self Care | Payer: 59 | Source: Ambulatory Visit | Attending: General Surgery | Admitting: General Surgery

## 2011-08-31 DIAGNOSIS — R1031 Right lower quadrant pain: Secondary | ICD-10-CM

## 2011-08-31 MED ORDER — IOHEXOL 300 MG/ML  SOLN: 100.0000 mL | Freq: Once | INTRAMUSCULAR | Status: AC | PRN

## 2011-09-07 ENCOUNTER — Telehealth (INDEPENDENT_AMBULATORY_CARE_PROVIDER_SITE_OTHER): Payer: Self-pay

## 2011-09-07 NOTE — Telephone Encounter (Signed)
LMOM for pt to call me so I can give test results and discuss referral to GI per Dr Dwain Sarna.Hulda Humphrey

## 2011-09-11 ENCOUNTER — Telehealth (INDEPENDENT_AMBULATORY_CARE_PROVIDER_SITE_OTHER): Payer: Self-pay

## 2011-09-11 NOTE — Telephone Encounter (Signed)
Returned [employer] again.Pt requesting we fax her last office note and CT scan to Dr Loreta Ave. I did fax the notes along with CT report to [phone]/ [facility]

## 2011-09-11 NOTE — Telephone Encounter (Signed)
Called pt to notify her that her xray results were normal per Dr Dwain Sarna. I did ask if pt had a follow up appt with Dr Loreta Ave and she does have a appt on 09-25-11. I did advise that the pt needed to be checked by Dr Loreta Ave again and if there is a surgical issue we can help her with to call our office again for appt with Dr Dwain Sarna.Paige Wilson

## 2012-01-11 ENCOUNTER — Encounter (HOSPITAL_COMMUNITY): Payer: Self-pay | Admitting: *Deleted

## 2012-01-11 ENCOUNTER — Emergency Department (HOSPITAL_COMMUNITY)
Admission: EM | Admit: 2012-01-11 | Discharge: 2012-01-11 | Disposition: A | Discharge status: Home / Self Care | Payer: 59 | Attending: Emergency Medicine | Admitting: Emergency Medicine

## 2012-01-11 DIAGNOSIS — I10 Essential (primary) hypertension: Secondary | ICD-10-CM | POA: Insufficient documentation

## 2012-01-11 DIAGNOSIS — F17200 Nicotine dependence, unspecified, uncomplicated: Secondary | ICD-10-CM | POA: Insufficient documentation

## 2012-01-11 DIAGNOSIS — L299 Pruritus, unspecified: Secondary | ICD-10-CM | POA: Insufficient documentation

## 2012-01-11 DIAGNOSIS — K219 Gastro-esophageal reflux disease without esophagitis: Secondary | ICD-10-CM | POA: Insufficient documentation

## 2012-01-11 DIAGNOSIS — Z7982 Long term (current) use of aspirin: Secondary | ICD-10-CM | POA: Insufficient documentation

## 2012-01-11 DIAGNOSIS — T7840XA Allergy, unspecified, initial encounter: Secondary | ICD-10-CM | POA: Insufficient documentation

## 2012-01-11 MED ORDER — METHYLPREDNISOLONE SODIUM SUCC 125 MG IJ SOLR
INTRAMUSCULAR | Status: AC
  Administered 2012-01-11: 05:00:00
  Filled 2012-01-11: qty 2

## 2012-01-11 MED ORDER — FAMOTIDINE 20 MG PO TABS: 20.0000 mg | ORAL_TABLET | Freq: Two times a day (BID) | ORAL | Status: DC

## 2012-01-11 MED ORDER — PREDNISONE 50 MG PO TABS: 50.0000 mg | ORAL_TABLET | Freq: Every day | ORAL | Status: DC

## 2012-01-11 MED ORDER — DIPHENHYDRAMINE HCL 50 MG/ML IJ SOLN
INTRAMUSCULAR | Status: AC
  Administered 2012-01-11: 05:00:00
  Filled 2012-01-11: qty 1

## 2012-01-11 NOTE — Discharge Instructions (Signed)

## 2012-01-11 NOTE — ED Notes (Signed)
Pt states she ate for lunch chicken, okra, cornbread, and tomatoes and took mucinex. Pt states she started to feel "itchy" after lunch. Pt states she "itchyness" was intermittent throughout the day. Pt reports eating shrimp for dinner around 7 pm. Pt reports no reaction after dinner. Pt states around 10 pm she started having hives, itching, and felt like her throat was closing up. Pt took 50 mg of benadryl without relief. Pt then called EMS approximately 11:45 pm. Pt is in no apparent distress, respirations are even and unlabored with a saturation of 100 on 2 lpm.

## 2012-01-11 NOTE — ED Notes (Signed)
Ambulated to bathroom without difficulty. Pt denied any shortness of breath and complaints of throat discomfort

## 2012-01-11 NOTE — ED Notes (Signed)
Per EMS: EMS called to pt's home for c/o of itching, throat tightness. Pt states at approx 1145 pm she started itching, having hives, and felt her throat was closing up. Pt states she took 50 mg of benadryl with no relief. ems en route gave another 50 mg IV benadryl and 125 solu-medrol IV with relief. Upon arrival pt was relieve of hives and itching. Pt states her throat feels a lot better. ems reports pt had shrimp for dinner and mucinex tonight around 5 pm. Pt states she started a new lotion as well today. Pt is A&O x 4 with airway intact. ems reports no stridor with clear lungs.

## 2012-01-11 NOTE — ED Notes (Signed)
[id number]<AV> Expected date:<BR> Expected time: 1:40 AM<BR> Means of arrival:Ambulance<BR> Comments:<BR> Weak/reposnds to verbal stimuli/BP initial 88 palpated, 130/60 on the monitor

## 2012-01-11 NOTE — ED Provider Notes (Addendum)
History     CSN: [id number]  Arrival date & time 01/11/12  0134   First MD Initiated Contact with Patient 01/11/12 [id number]      Chief Complaint  Patient presents with  . Allergic Reaction    (Consider location/radiation/quality/duration/timing/severity/associated sxs/prior treatment) Patient is a 56 y.o. female presenting with allergic reaction. The history is provided by the patient. No language interpreter was used.  Allergic Reaction The primary symptoms do not include wheezing, cough, nausea, vomiting, dizziness, palpitations, altered mental status, rash, angioedema or urticaria. Primary symptoms comment: pruritis The current episode started yesterday. The problem has been gradually improving (first got markedly worse this evening when took ibuprofen now better). This is a new problem.  Associated with: mucinex and ibuprofen. Significant symptoms also include itching. Significant symptoms that are not present include eye redness, flushing or rhinorrhea.  No swelling of the lips tongue or uvula.  No CP, SOB n/v/d. No urticaria.  No maculopapular lesions.  No fevers.  No new foods.    Past Medical History  Diagnosis Date  . Hyperlipidemia   . Arthritis   . Anemia   . GERD (gastroesophageal reflux disease)     Past Surgical History  Procedure Date  . Tonsilectomy, adenoidectomy, bilateral myringotomy and tubes 1975  . Tubal ligation 1997  . Nasal septum surgery 2011    Family History  Problem Relation Age of Onset  . Adopted: Yes    History  Substance Use Topics  . Smoking status: Current Everyday Smoker -- 1.0 packs/day    Types: Cigarettes  . Smokeless tobacco: Never Used  . Alcohol Use: No    OB History    Grav Para Term Preterm Abortions TAB SAB Ect Mult Living                  Review of Systems  Constitutional: Negative.   HENT: Negative for rhinorrhea.   Eyes: Negative for redness.  Respiratory: Negative for cough and wheezing.   Cardiovascular: Negative  for palpitations.  Gastrointestinal: Negative for nausea and vomiting.  Genitourinary: Negative.   Musculoskeletal: Negative.   Skin: Positive for itching. Negative for flushing and rash.  Neurological: Negative for dizziness.  Hematological: Negative.   Psychiatric/Behavioral: Negative.  Negative for altered mental status.    Allergies  Sulfamethoxazole w/trimethoprim; Sulfonamide derivatives; and Bactrim  Home Medications   Current Outpatient Rx  Name Route Sig Dispense Refill  . ASPIRIN 81 MG PO TABS Oral Take 81 mg by mouth daily.      Marland Kitchen CALCIUM CITRATE-VITAMIN D 250-100 MG-UNIT PO TABS Oral Take 1 tablet by mouth daily.      Marland Kitchen FLUTICASONE PROPIONATE 50 MCG/ACT NA SUSP Nasal 2 sprays by Nasal route daily. 16 g 11  . GUAIFENESIN ER 600 MG PO TB12 Oral Take 1,200 mg by mouth 2 (two) times daily.    Marland Kitchen LORATADINE 10 MG PO TABS Oral Take 10 mg by mouth daily.      Marland Kitchen PANTOPRAZOLE SODIUM 40 MG PO TBEC Oral Take 1 tablet (40 mg total) by mouth daily. 100 tablet 3    BP 130/77  Pulse 79  Temp(Src) 98.2 F (36.8 C) (Oral)  Resp 18  SpO2 94%  Physical Exam  Constitutional: She is oriented to person, place, and time. She appears well-developed and well-nourished.  HENT:  Head: Normocephalic and atraumatic.  Mouth/Throat: Oropharynx is clear and moist. No oropharyngeal exudate.       No swelling of the lips tongue or uvula  Eyes:  EOM are normal. Pupils are equal, round, and reactive to light.  Neck: Normal range of motion. Neck supple.  Cardiovascular: Normal rate and regular rhythm.   Pulmonary/Chest: Effort normal and breath sounds normal. No stridor. She has no wheezes. She has no rales.  Abdominal: Soft. Bowel sounds are normal. There is no tenderness. There is no rebound and no guarding.  Musculoskeletal: Normal range of motion. She exhibits no edema.  Lymphadenopathy:    She has no cervical adenopathy.  Neurological: She is alert and oriented to person, place, and time.    Skin: Skin is warm and dry. No rash noted.  Psychiatric: She has a normal mood and affect.    ED Course  Procedures (including critical care time)  Labs Reviewed - No data to display No results found.   No diagnosis found.    MDM  Observed for several hours.  Return for swelling or the lips tongue or uvula or any concerns.  Patient verbalizes understanding and agrees to follow up.  No more mucinex or ibuprofen        Shakari Qazi K Dhruvan Gullion-Rasch, MD 01/11/12 0506  Harlon Kutner K Rickayla Wieland-Rasch, MD 01/11/12 [phone]

## 2012-01-25 ENCOUNTER — Ambulatory Visit (INDEPENDENT_AMBULATORY_CARE_PROVIDER_SITE_OTHER): Payer: 59 | Admitting: *Deleted

## 2012-01-25 DIAGNOSIS — Z23 Encounter for immunization: Secondary | ICD-10-CM

## 2012-03-26 ENCOUNTER — Other Ambulatory Visit (INDEPENDENT_AMBULATORY_CARE_PROVIDER_SITE_OTHER): Payer: 59

## 2012-03-26 DIAGNOSIS — Z0000 Encounter for general adult medical examination without abnormal findings: Secondary | ICD-10-CM

## 2012-03-26 LAB — POCT URINALYSIS DIPSTICK
Nitrite, UA: NEGATIVE
Protein, UA: NEGATIVE
Urobilinogen, UA: 0.2
pH, UA: 7

## 2012-03-26 LAB — HEPATIC FUNCTION PANEL
Alkaline Phosphatase: 75 U/L (ref 39–117)
Bilirubin, Direct: 0 mg/dL (ref 0.0–0.3)
Total Bilirubin: 0.5 mg/dL (ref 0.3–1.2)

## 2012-03-26 LAB — BASIC METABOLIC PANEL
BUN: 13 mg/dL (ref 6–23)
Calcium: 9.4 mg/dL (ref 8.4–10.5)
Chloride: 107 mEq/L (ref 96–112)
Creatinine, Ser: 0.9 mg/dL (ref 0.4–1.2)

## 2012-03-26 LAB — CBC WITH DIFFERENTIAL/PLATELET
Eosinophils Absolute: 0.1 10*3/uL (ref 0.0–0.7)
Eosinophils Relative: 0.8 % (ref 0.0–5.0)
Lymphocytes Relative: 16.2 % (ref 12.0–46.0)
MCHC: 33.1 g/dL (ref 30.0–36.0)
MCV: 90.5 fl (ref 78.0–100.0)
Monocytes Absolute: 0.5 10*3/uL (ref 0.1–1.0)
Neutrophils Relative %: 77.7 % — ABNORMAL HIGH (ref 43.0–77.0)
Platelets: 297 10*3/uL (ref 150.0–400.0)
WBC: 10.5 10*3/uL (ref 4.5–10.5)

## 2012-03-26 LAB — LIPID PANEL
Cholesterol: 246 mg/dL — ABNORMAL HIGH (ref 0–200)
Total CHOL/HDL Ratio: 4
Triglycerides: 160 mg/dL — ABNORMAL HIGH (ref 0.0–149.0)
VLDL: 32 mg/dL (ref 0.0–40.0)

## 2012-04-01 ENCOUNTER — Other Ambulatory Visit (HOSPITAL_COMMUNITY)
Admission: RE | Admit: 2012-04-01 | Discharge: 2012-04-01 | Disposition: A | Discharge status: Home / Self Care | Payer: 59 | Source: Ambulatory Visit | Attending: Family Medicine | Admitting: Family Medicine

## 2012-04-01 ENCOUNTER — Encounter: Payer: Self-pay | Admitting: Family Medicine

## 2012-04-01 ENCOUNTER — Ambulatory Visit (INDEPENDENT_AMBULATORY_CARE_PROVIDER_SITE_OTHER): Payer: 59 | Admitting: Family Medicine

## 2012-04-01 VITALS — BP 110/80 | Temp 98.4°F | Ht 62.0 in | Wt 152.0 lb

## 2012-04-01 DIAGNOSIS — Z01419 Encounter for gynecological examination (general) (routine) without abnormal findings: Secondary | ICD-10-CM | POA: Insufficient documentation

## 2012-04-01 DIAGNOSIS — J309 Allergic rhinitis, unspecified: Secondary | ICD-10-CM

## 2012-04-01 DIAGNOSIS — F17200 Nicotine dependence, unspecified, uncomplicated: Secondary | ICD-10-CM

## 2012-04-01 DIAGNOSIS — N760 Acute vaginitis: Secondary | ICD-10-CM

## 2012-04-01 DIAGNOSIS — Z0000 Encounter for general adult medical examination without abnormal findings: Secondary | ICD-10-CM

## 2012-04-01 MED ORDER — PANTOPRAZOLE SODIUM 40 MG PO TBEC: 40.0000 mg | DELAYED_RELEASE_TABLET | Freq: Every day | ORAL | Status: DC

## 2012-04-01 MED ORDER — VARENICLINE TARTRATE 1 MG PO TABS: ORAL_TABLET | ORAL | Status: DC

## 2012-04-01 MED ORDER — ESTRADIOL 0.1 MG/GM VA CREA: 2.0000 g | TOPICAL_CREAM | Freq: Every day | VAGINAL | Status: AC

## 2012-04-01 NOTE — Progress Notes (Signed)
  Subjective:    Patient ID: Paige Wilson, female    DOB: 08-27-1956, 56 y.o.   MRN: [id number]  HPI Paige Wilson is a 56 year old female who comes in today for general medical examination  She is 3 years postmenopausal and having a lot of difficulty with dry skin and dry vagina discuss hormonal cream  She takes Protonix 40 mg daily for reflux and an aspirin tablet calcium vitamin D.  This winter she had to call EMS because of allergic reaction question dye and some medication. She went to see her allergist. She's currently on Singulair 10 mg daily. Health maintenance normal except she does not do BSE monthly recent mammogram normal colonoscopy x3 tetanus 2012 Pneumovax 2013   Review of Systems  Constitutional: Negative.   HENT: Negative.   Eyes: Negative.   Respiratory: Negative.   Cardiovascular: Negative.   Gastrointestinal: Negative.   Genitourinary: Negative.   Musculoskeletal: Negative.   Neurological: Negative.   Hematological: Negative.   Psychiatric/Behavioral: Negative.        Objective:   Physical Exam  Constitutional: She appears well-developed and well-nourished.  HENT:  Head: Normocephalic and atraumatic.  Right Ear: External ear normal.  Left Ear: External ear normal.  Nose: Nose normal.  Mouth/Throat: Oropharynx is clear and moist.  Eyes: EOM are normal. Pupils are equal, round, and reactive to light.  Neck: Normal range of motion. Neck supple. No thyromegaly present.  Cardiovascular: Normal rate, regular rhythm, normal heart sounds and intact distal pulses.  Exam reveals no gallop and no friction rub.   No murmur heard. Pulmonary/Chest: Effort normal and breath sounds normal.  Abdominal: Soft. Bowel sounds are normal. She exhibits no distension and no mass. There is no tenderness. There is no rebound.  Genitourinary: Vagina normal and uterus normal. Guaiac negative stool. No vaginal discharge found.       Dry vagina  Musculoskeletal: Normal range of motion.    Lymphadenopathy:    She has no cervical adenopathy.  Neurological: She is alert. She has normal reflexes. No cranial nerve deficit. She exhibits normal muscle tone. Coordination normal.  Skin: Skin is warm and dry.  Psychiatric: She has a normal mood and affect. Her behavior is normal. Judgment and thought content normal.          Assessment & Plan:  Healthy female  Tobacco abuse restart the Chantix program followup in one month  Reflux esophagitis continue Protonix  Allergic reaction unknown etiology followed by immunology

## 2012-04-01 NOTE — Patient Instructions (Signed)
Restart the Chantix one half tablet daily in the morning  Begin a tapering program,,,,,,,,, 20,,,,,,,,, 15,,,,,,, 10,,,,,,,,, 5,,,,,,,, for,,,,,,, 3,,,,,,,, 2,,,,,,,,, 1,,,,,,,,,, 0  Followup in 4 weeks  Use small amounts of the hormonal cream twice weekly

## 2012-05-01 ENCOUNTER — Ambulatory Visit (INDEPENDENT_AMBULATORY_CARE_PROVIDER_SITE_OTHER): Payer: 59 | Admitting: Family Medicine

## 2012-05-01 ENCOUNTER — Encounter: Payer: Self-pay | Admitting: Family Medicine

## 2012-05-01 VITALS — BP 110/80 | Temp 98.0°F | Wt 149.0 lb

## 2012-05-01 DIAGNOSIS — F17200 Nicotine dependence, unspecified, uncomplicated: Secondary | ICD-10-CM

## 2012-05-01 NOTE — Progress Notes (Signed)
  Subjective:    Patient ID: Paige Wilson, female    DOB: 01-Nov-1956, 56 y.o.   MRN: [id number]  HPI Linda is a 56 year old single female who comes in today on the Chantix program for followup  We started her on a half a tablet a day and she's cut her cigarette consumption from 20-15 but seems to be stopped on that number. No side effects from the medication    Review of Systems    general and psychologic review of systems otherwise negative Objective:   Physical Exam Well-developed well-nourished female in no acute distress       Assessment & Plan:  Ongoing tobacco abuse plan increase Chantix to one half tablet twice a day taper over 6 weeks followup in 6 weeks

## 2012-05-01 NOTE — Patient Instructions (Signed)
Increase the Chantix to one half tablet twice daily  Begin to taper by 2 per week .........Marland Kitchen     13,,,9...7..etc return in 6 weeks for followup

## 2012-06-04 ENCOUNTER — Other Ambulatory Visit: Payer: Self-pay | Admitting: *Deleted

## 2012-06-04 DIAGNOSIS — F17200 Nicotine dependence, unspecified, uncomplicated: Secondary | ICD-10-CM

## 2012-06-04 MED ORDER — VARENICLINE TARTRATE 1 MG PO TABS: ORAL_TABLET | ORAL | Status: DC

## 2013-06-24 ENCOUNTER — Other Ambulatory Visit: Payer: Self-pay | Admitting: Family Medicine

## 2013-09-21 ENCOUNTER — Other Ambulatory Visit: Payer: Self-pay | Admitting: Family Medicine

## 2013-11-11 ENCOUNTER — Encounter: Payer: Self-pay | Admitting: Family Medicine

## 2013-12-25 ENCOUNTER — Other Ambulatory Visit: Payer: Self-pay | Admitting: Family Medicine

## 2014-02-11 ENCOUNTER — Other Ambulatory Visit (INDEPENDENT_AMBULATORY_CARE_PROVIDER_SITE_OTHER): Payer: 59

## 2014-02-11 DIAGNOSIS — Z0000 Encounter for general adult medical examination without abnormal findings: Secondary | ICD-10-CM

## 2014-02-11 LAB — BASIC METABOLIC PANEL
BUN: 12 mg/dL (ref 6–23)
CHLORIDE: 101 meq/L (ref 96–112)
CO2: 28 mEq/L (ref 19–32)
Calcium: 9.6 mg/dL (ref 8.4–10.5)
Creatinine, Ser: 0.9 mg/dL (ref 0.4–1.2)
GFR: 65.51 mL/min (ref >60.00)
Glucose, Bld: 83 mg/dL (ref 70–99)
POTASSIUM: 4.2 meq/L (ref 3.5–5.1)
SODIUM: 139 meq/L (ref 135–145)

## 2014-02-11 LAB — HEPATIC FUNCTION PANEL
ALT: 10 U/L (ref 0–35)
AST: 16 U/L (ref 0–37)
Albumin: 4 g/dL (ref 3.5–5.2)
Alkaline Phosphatase: 65 U/L (ref 39–117)
BILIRUBIN DIRECT: 0 mg/dL (ref 0.0–0.3)
BILIRUBIN TOTAL: 0.5 mg/dL (ref 0.3–1.2)
TOTAL PROTEIN: 7 g/dL (ref 6.0–8.3)

## 2014-02-11 LAB — LIPID PANEL
CHOL/HDL RATIO: 5
Cholesterol: 280 mg/dL — ABNORMAL HIGH (ref 0–200)
HDL: 61.2 mg/dL (ref >39.00)
LDL CALC: 190 mg/dL — AB (ref 0–99)
Triglycerides: 142 mg/dL (ref 0.0–149.0)
VLDL: 28.4 mg/dL (ref 0.0–40.0)

## 2014-02-11 LAB — CBC WITH DIFFERENTIAL/PLATELET
BASOS PCT: 0.4 % (ref 0.0–3.0)
Basophils Absolute: 0.1 10*3/uL (ref 0.0–0.1)
EOS PCT: 1.4 % (ref 0.0–5.0)
Eosinophils Absolute: 0.2 10*3/uL (ref 0.0–0.7)
HEMATOCRIT: 41.8 % (ref 36.0–46.0)
Hemoglobin: 14 g/dL (ref 12.0–15.0)
LYMPHS ABS: 2.6 10*3/uL (ref 0.7–4.0)
Lymphocytes Relative: 18.8 % (ref 12.0–46.0)
MCHC: 33.5 g/dL (ref 30.0–36.0)
MCV: 92 fl (ref 78.0–100.0)
MONO ABS: 0.6 10*3/uL (ref 0.1–1.0)
MONOS PCT: 4.3 % (ref 3.0–12.0)
NEUTROS PCT: 75.1 % (ref 43.0–77.0)
Neutro Abs: 10.2 10*3/uL — ABNORMAL HIGH (ref 1.4–7.7)
PLATELETS: 352 10*3/uL (ref 150.0–400.0)
RBC: 4.54 Mil/uL (ref 3.87–5.11)
RDW: 13 % (ref 11.5–14.6)
WBC: 13.6 10*3/uL — AB (ref 4.5–10.5)

## 2014-02-11 LAB — POCT URINALYSIS DIPSTICK
BILIRUBIN UA: NEGATIVE
Glucose, UA: NEGATIVE
KETONES UA: NEGATIVE
LEUKOCYTES UA: NEGATIVE
Nitrite, UA: NEGATIVE
PH UA: 7
PROTEIN UA: NEGATIVE
Spec Grav, UA: 1.01
Urobilinogen, UA: 0.2

## 2014-02-11 LAB — TSH: TSH: 0.42 u[IU]/mL (ref 0.35–5.50)

## 2014-02-23 ENCOUNTER — Telehealth: Payer: Self-pay | Admitting: Family Medicine

## 2014-02-23 ENCOUNTER — Encounter: Payer: Self-pay | Admitting: Family Medicine

## 2014-02-23 ENCOUNTER — Ambulatory Visit (INDEPENDENT_AMBULATORY_CARE_PROVIDER_SITE_OTHER): Payer: 59 | Admitting: Family Medicine

## 2014-02-23 VITALS — BP 120/80 | Temp 98.3°F | Ht 62.0 in | Wt 149.0 lb

## 2014-02-23 DIAGNOSIS — R1011 Right upper quadrant pain: Secondary | ICD-10-CM

## 2014-02-23 DIAGNOSIS — M549 Dorsalgia, unspecified: Secondary | ICD-10-CM

## 2014-02-23 DIAGNOSIS — G8929 Other chronic pain: Secondary | ICD-10-CM

## 2014-02-23 DIAGNOSIS — E785 Hyperlipidemia, unspecified: Secondary | ICD-10-CM

## 2014-02-23 DIAGNOSIS — F17200 Nicotine dependence, unspecified, uncomplicated: Secondary | ICD-10-CM

## 2014-02-23 DIAGNOSIS — J309 Allergic rhinitis, unspecified: Secondary | ICD-10-CM

## 2014-02-23 MED ORDER — MONTELUKAST SODIUM 10 MG PO TABS: 10.0000 mg | ORAL_TABLET | Freq: Every day | ORAL | Status: DC

## 2014-02-23 MED ORDER — VARENICLINE TARTRATE 1 MG PO TABS: ORAL_TABLET | ORAL | Status: DC

## 2014-02-23 NOTE — Telephone Encounter (Signed)
Relevant patient education mailed to patient.  

## 2014-02-23 NOTE — Progress Notes (Signed)
   Subjective:    Patient ID: Paige Wilson, female    DOB: [birth year], 58 y.o.   MRN: [id number]  HPI  Kazaria is a 58 year old single female smoker a pack a day who comes in today for general physical examination  She was taking Singulair for allergic rhinitis and stopped taking it now her allergies are bad. The most contributing problem of allergies or smoking.  She smoked a pack a day. We gave her some Chantix a try she took a half a tablet a day had some side effects and quit taking it. We will try a different approach  She takes Protonix daily from her GI because of chronic reflux and abdominal pain  She gets routine eye care, dental care, and does not check her breasts monthly, does get a mammogram, followup colonoscopy and GI,  She's also concerned about a spot on her nose.  She works as a Art therapist. LMP about 5 years ago last Pap was March. No problems with her Paps ever therefore recommended screening Paps every 3 years.  Review of Systems  Constitutional: Negative.   HENT: Negative.   Eyes: Negative.   Respiratory: Negative.   Cardiovascular: Negative.   Gastrointestinal: Negative.   Genitourinary: Negative.   Musculoskeletal: Negative.   Neurological: Negative.   Psychiatric/Behavioral: Negative.        Objective:   Physical Exam  Nursing note and vitals reviewed. Constitutional: She is oriented to person, place, and time. She appears well-developed and well-nourished.  HENT:  Head: Normocephalic and atraumatic.  Right Ear: External ear normal.  Left Ear: External ear normal.  Nose: Nose normal.  Mouth/Throat: Oropharynx is clear and moist.  Eyes: EOM are normal. Pupils are equal, round, and reactive to light.  Neck: Normal range of motion. Neck supple. No thyromegaly present.  Cardiovascular: Normal rate, regular rhythm, normal heart sounds and intact distal pulses.  Exam reveals no gallop and no friction rub.   No murmur heard. No carotid or aortic  bruits peripheral pulses 1+ and symmetrical  Pulmonary/Chest: Effort normal and breath sounds normal.  Abdominal: Soft. Bowel sounds are normal. She exhibits no distension and no mass. There is no tenderness. There is no rebound.  Genitourinary:  Pelvic and Pap by GYN  Bilateral breast exam normal  BSE was taught  Musculoskeletal: Normal range of motion.  Lymphadenopathy:    She has no cervical adenopathy.  Neurological: She is alert and oriented to person, place, and time. She has normal reflexes. No cranial nerve deficit. She exhibits normal muscle tone. Coordination normal.  Skin: Skin is warm and dry.  Total body skin exam normal she has 2 small moles on her face and nose  Psychiatric: She has a normal mood and affect. Her behavior is normal. Judgment and thought content normal.          Assessment & Plan:  Allergic rhinitis restart Singulair and Zyrtec  Tobacco abuse restart Chantix program followup in 5 weeks  Reflux esophagitis Protonix by GYN

## 2014-02-23 NOTE — Progress Notes (Signed)
Pre visit review using our clinic review tool, if applicable. No additional management support is needed unless otherwise documented below in the visit note. 

## 2014-02-23 NOTE — Patient Instructions (Signed)
Restart the Chantix,,,,,,,,,, 0.5 mg Monday Wednesday Friday  Begin tapering by 2 per day  Return  in 5 weeks for followup  Restart Singulair,,,,,,, 1 at bedtime  Plain Zyrtec,,,,,,, 1 at bedtime  BSE monthly

## 2014-03-12 DIAGNOSIS — G5603 Carpal tunnel syndrome, bilateral upper limbs: Secondary | ICD-10-CM

## 2014-03-12 HISTORY — DX: Carpal tunnel syndrome, bilateral upper limbs: G56.03

## 2014-03-24 ENCOUNTER — Other Ambulatory Visit: Payer: Self-pay | Admitting: Orthopedic Surgery

## 2014-03-29 ENCOUNTER — Encounter (HOSPITAL_BASED_OUTPATIENT_CLINIC_OR_DEPARTMENT_OTHER): Payer: Self-pay | Admitting: *Deleted

## 2014-03-30 ENCOUNTER — Other Ambulatory Visit: Payer: Self-pay | Admitting: Family Medicine

## 2014-04-02 ENCOUNTER — Ambulatory Visit (HOSPITAL_BASED_OUTPATIENT_CLINIC_OR_DEPARTMENT_OTHER)
Admission: RE | Admit: 2014-04-02 | Discharge: 2014-04-02 | Disposition: A | Discharge status: Home / Self Care | Payer: 59 | Source: Ambulatory Visit | Attending: Orthopedic Surgery | Admitting: Orthopedic Surgery

## 2014-04-02 ENCOUNTER — Encounter (HOSPITAL_BASED_OUTPATIENT_CLINIC_OR_DEPARTMENT_OTHER)
Admission: RE | Disposition: A | Discharge status: Home / Self Care | Payer: Self-pay | Source: Ambulatory Visit | Attending: Orthopedic Surgery

## 2014-04-02 ENCOUNTER — Ambulatory Visit (HOSPITAL_BASED_OUTPATIENT_CLINIC_OR_DEPARTMENT_OTHER): Payer: 59 | Admitting: Anesthesiology

## 2014-04-02 ENCOUNTER — Encounter (HOSPITAL_BASED_OUTPATIENT_CLINIC_OR_DEPARTMENT_OTHER): Payer: 59 | Admitting: Anesthesiology

## 2014-04-02 ENCOUNTER — Encounter (HOSPITAL_BASED_OUTPATIENT_CLINIC_OR_DEPARTMENT_OTHER): Payer: Self-pay | Admitting: *Deleted

## 2014-04-02 DIAGNOSIS — Z7982 Long term (current) use of aspirin: Secondary | ICD-10-CM | POA: Insufficient documentation

## 2014-04-02 DIAGNOSIS — K219 Gastro-esophageal reflux disease without esophagitis: Secondary | ICD-10-CM | POA: Insufficient documentation

## 2014-04-02 DIAGNOSIS — M19049 Primary osteoarthritis, unspecified hand: Secondary | ICD-10-CM | POA: Insufficient documentation

## 2014-04-02 DIAGNOSIS — Z882 Allergy status to sulfonamides status: Secondary | ICD-10-CM | POA: Insufficient documentation

## 2014-04-02 DIAGNOSIS — E785 Hyperlipidemia, unspecified: Secondary | ICD-10-CM | POA: Insufficient documentation

## 2014-04-02 DIAGNOSIS — F17200 Nicotine dependence, unspecified, uncomplicated: Secondary | ICD-10-CM | POA: Insufficient documentation

## 2014-04-02 DIAGNOSIS — Z79899 Other long term (current) drug therapy: Secondary | ICD-10-CM | POA: Insufficient documentation

## 2014-04-02 DIAGNOSIS — G5600 Carpal tunnel syndrome, unspecified upper limb: Secondary | ICD-10-CM | POA: Insufficient documentation

## 2014-04-02 HISTORY — PX: CARPAL TUNNEL RELEASE: SHX101

## 2014-04-02 HISTORY — DX: Dental restoration status: Z98.811

## 2014-04-02 HISTORY — DX: Carpal tunnel syndrome, bilateral upper limbs: G56.03

## 2014-04-02 SURGERY — CARPAL TUNNEL RELEASE
Anesthesia: General | Site: Arm Upper | Laterality: Bilateral

## 2014-04-02 MED ORDER — MIDAZOLAM HCL 2 MG/2ML IJ SOLN
INTRAMUSCULAR | Status: AC
  Filled 2014-04-02: qty 2

## 2014-04-02 MED ORDER — PROPOFOL 10 MG/ML IV BOLUS
INTRAVENOUS | Status: AC
  Filled 2014-04-02: qty 20

## 2014-04-02 MED ORDER — BUPIVACAINE HCL (PF) 0.25 % IJ SOLN
INTRAMUSCULAR | Status: AC
  Filled 2014-04-02: qty 30

## 2014-04-02 MED ORDER — CEFAZOLIN SODIUM-DEXTROSE 2-3 GM-% IV SOLR
2.0000 g | INTRAVENOUS | Status: AC
  Administered 2014-04-02: 2 g via INTRAVENOUS

## 2014-04-02 MED ORDER — BUPIVACAINE HCL (PF) 0.5 % IJ SOLN
INTRAMUSCULAR | Status: DC | PRN
  Administered 2014-04-02: 5 mL

## 2014-04-02 MED ORDER — ONDANSETRON HCL 4 MG/2ML IJ SOLN: 4.0000 mg | Freq: Once | INTRAMUSCULAR | Status: DC | PRN

## 2014-04-02 MED ORDER — FENTANYL CITRATE 0.05 MG/ML IJ SOLN
INTRAMUSCULAR | Status: DC | PRN
  Administered 2014-04-02 (×2): 50 ug via INTRAVENOUS

## 2014-04-02 MED ORDER — MIDAZOLAM HCL 2 MG/ML PO SYRP: 12.0000 mg | ORAL_SOLUTION | Freq: Once | ORAL | Status: DC | PRN

## 2014-04-02 MED ORDER — MIDAZOLAM HCL 5 MG/5ML IJ SOLN
INTRAMUSCULAR | Status: DC | PRN
  Administered 2014-04-02: 2 mg via INTRAVENOUS

## 2014-04-02 MED ORDER — LACTATED RINGERS IV SOLN
INTRAVENOUS | Status: DC
  Administered 2014-04-02: 14:00:00 via INTRAVENOUS

## 2014-04-02 MED ORDER — HYDROMORPHONE HCL PF 1 MG/ML IJ SOLN
0.2500 mg | INTRAMUSCULAR | Status: DC | PRN
  Administered 2014-04-02: 0.5 mg via INTRAVENOUS

## 2014-04-02 MED ORDER — CEFAZOLIN SODIUM-DEXTROSE 2-3 GM-% IV SOLR: 2.0000 g | INTRAVENOUS | Status: DC

## 2014-04-02 MED ORDER — OXYCODONE HCL 5 MG PO TABS
ORAL_TABLET | ORAL | Status: AC
  Filled 2014-04-02: qty 1

## 2014-04-02 MED ORDER — PROPOFOL 10 MG/ML IV BOLUS
INTRAVENOUS | Status: DC | PRN
  Administered 2014-04-02: 130 mg via INTRAVENOUS

## 2014-04-02 MED ORDER — BUPIVACAINE HCL (PF) 0.25 % IJ SOLN
INTRAMUSCULAR | Status: DC | PRN
  Administered 2014-04-02: 5 mL

## 2014-04-02 MED ORDER — OXYCODONE HCL 5 MG PO TABS
5.0000 mg | ORAL_TABLET | Freq: Once | ORAL | Status: AC | PRN
  Administered 2014-04-02: 5 mg via ORAL

## 2014-04-02 MED ORDER — CHLORHEXIDINE GLUCONATE 4 % EX LIQD: 60.0000 mL | Freq: Once | CUTANEOUS | Status: DC

## 2014-04-02 MED ORDER — FENTANYL CITRATE 0.05 MG/ML IJ SOLN
INTRAMUSCULAR | Status: AC
  Filled 2014-04-02: qty 4

## 2014-04-02 MED ORDER — DEXAMETHASONE SODIUM PHOSPHATE 4 MG/ML IJ SOLN
INTRAMUSCULAR | Status: DC | PRN
  Administered 2014-04-02: 10 mg via INTRAVENOUS

## 2014-04-02 MED ORDER — FENTANYL CITRATE 0.05 MG/ML IJ SOLN: 50.0000 ug | INTRAMUSCULAR | Status: DC | PRN

## 2014-04-02 MED ORDER — HYDROMORPHONE HCL PF 1 MG/ML IJ SOLN: 0.2500 mg | INTRAMUSCULAR | Status: DC | PRN

## 2014-04-02 MED ORDER — HYDROMORPHONE HCL PF 1 MG/ML IJ SOLN
INTRAMUSCULAR | Status: AC
  Filled 2014-04-02: qty 1

## 2014-04-02 MED ORDER — OXYCODONE HCL 5 MG/5ML PO SOLN: 5.0000 mg | Freq: Once | ORAL | Status: AC | PRN

## 2014-04-02 MED ORDER — MIDAZOLAM HCL 2 MG/2ML IJ SOLN: 1.0000 mg | INTRAMUSCULAR | Status: DC | PRN

## 2014-04-02 MED ORDER — HYDROCODONE-ACETAMINOPHEN 5-325 MG PO TABS: 1.0000 | ORAL_TABLET | Freq: Four times a day (QID) | ORAL | Status: AC | PRN

## 2014-04-02 MED ORDER — ONDANSETRON HCL 4 MG/2ML IJ SOLN
INTRAMUSCULAR | Status: DC | PRN
  Administered 2014-04-02: 4 mg via INTRAVENOUS

## 2014-04-02 SURGICAL SUPPLY — 42 items
ADH SKN CLS APL DERMABOND .7 (GAUZE/BANDAGES/DRESSINGS) ×2
BLADE SURG 15 STRL LF DISP TIS (BLADE) ×2 IMPLANT
BLADE SURG 15 STRL SS (BLADE) ×4
BNDG CMPR 9X4 STRL LF SNTH (GAUZE/BANDAGES/DRESSINGS) ×1
BNDG COHESIVE 3X5 TAN STRL LF (GAUZE/BANDAGES/DRESSINGS) ×3 IMPLANT
BNDG ESMARK 4X9 LF (GAUZE/BANDAGES/DRESSINGS) ×2 IMPLANT
BNDG GAUZE ELAST 4 BULKY (GAUZE/BANDAGES/DRESSINGS) ×3 IMPLANT
CHLORAPREP W/TINT 26ML (MISCELLANEOUS) ×3 IMPLANT
CORDS BIPOLAR (ELECTRODE) ×2 IMPLANT
COVER MAYO STAND STRL (DRAPES) ×4 IMPLANT
COVER TABLE BACK 60X90 (DRAPES) ×2 IMPLANT
CUFF TOURNIQUET SINGLE 18IN (TOURNIQUET CUFF) ×8 IMPLANT
DERMABOND ADVANCED (GAUZE/BANDAGES/DRESSINGS) ×2
DERMABOND ADVANCED .7 DNX12 (GAUZE/BANDAGES/DRESSINGS) ×2 IMPLANT
DRAPE EXTREMITY T 121X128X90 (DRAPE) ×4 IMPLANT
DRAPE SURG 17X23 STRL (DRAPES) ×4 IMPLANT
DRSG PAD ABDOMINAL 8X10 ST (GAUZE/BANDAGES/DRESSINGS) ×4 IMPLANT
GAUZE SPONGE 4X4 12PLY STRL (GAUZE/BANDAGES/DRESSINGS) ×4 IMPLANT
GAUZE XEROFORM 1X8 LF (GAUZE/BANDAGES/DRESSINGS) ×2 IMPLANT
GLOVE BIOGEL PI IND STRL 8.5 (GLOVE) ×1 IMPLANT
GLOVE BIOGEL PI INDICATOR 8.5 (GLOVE) ×1
GLOVE EXAM NITRILE MD LF STRL (GLOVE) ×2 IMPLANT
GLOVE SURG ORTHO 8.0 STRL STRW (GLOVE) ×2 IMPLANT
GLOVE SURG SS PI 7.0 STRL IVOR (GLOVE) ×2 IMPLANT
GOWN STRL REUS W/ TWL LRG LVL3 (GOWN DISPOSABLE) ×1 IMPLANT
GOWN STRL REUS W/TWL LRG LVL3 (GOWN DISPOSABLE) ×2
GOWN STRL REUS W/TWL XL LVL3 (GOWN DISPOSABLE) ×2 IMPLANT
NEEDLE 27GAX1X1/2 (NEEDLE) ×2 IMPLANT
NS IRRIG 1000ML POUR BTL (IV SOLUTION) ×2 IMPLANT
PACK BASIN DAY SURGERY FS (CUSTOM PROCEDURE TRAY) ×2 IMPLANT
PAD ALCOHOL SWAB (MISCELLANEOUS) ×4 IMPLANT
PAD CAST 3X4 CTTN HI CHSV (CAST SUPPLIES) IMPLANT
PADDING CAST ABS 4INX4YD NS (CAST SUPPLIES) ×1
PADDING CAST ABS COTTON 4X4 ST (CAST SUPPLIES) ×1 IMPLANT
PADDING CAST COTTON 3X4 STRL (CAST SUPPLIES)
STOCKINETTE 4X48 STRL (DRAPES) ×4 IMPLANT
SUT VICRYL 4-0 PS2 18IN ABS (SUTURE) ×4 IMPLANT
SUT VICRYL RAPIDE 4/0 PS 2 (SUTURE) ×2 IMPLANT
SYR BULB 3OZ (MISCELLANEOUS) ×2 IMPLANT
SYR CONTROL 10ML LL (SYRINGE) ×2 IMPLANT
TOWEL OR 17X24 6PK STRL BLUE (TOWEL DISPOSABLE) ×2 IMPLANT
UNDERPAD 30X30 INCONTINENT (UNDERPADS AND DIAPERS) ×2 IMPLANT

## 2014-04-02 NOTE — Op Note (Signed)
Dictation Number [id number]

## 2014-04-02 NOTE — Anesthesia Postprocedure Evaluation (Signed)
  Anesthesia Post-op Note  Patient: Paige Wilson  Procedure(s) Performed: Procedure(s) with comments: BILATERAL CARPAL TUNNEL RELEASE (Bilateral) - Left carpal tunnel release first        Patient Location: PACU  Anesthesia Type:General  Level of Consciousness: awake, alert  and oriented  Airway and Oxygen Therapy: Patient Spontanous Breathing and Patient connected to nasal cannula oxygen  Post-op Pain: mild  Post-op Assessment: Post-op Vital signs reviewed, Patient's Cardiovascular Status Stable, Respiratory Function Stable, Patent Airway, No signs of Nausea or vomiting and Pain level controlled  Post-op Vital Signs: stable  Last Vitals:  Filed Vitals:   04/02/14 1545  BP: 185/83  Pulse: 75  Temp:   Resp: 11    Complications: No apparent anesthesia complications

## 2014-04-02 NOTE — H&P (Signed)
Paige Wilson is 58 years old and is right hand dominant. She is complaining of pain in both hands, right slightly greater than left. This awakens her from sleep and has increased over the past 3 weeks but it has been present for a year or 2. She complains primarily of the middle and ring fingers. She has no history of injury to her hand or neck. She has no history of car accident. She is awakened 3-4 out of 7 nights. She has been taking ibuprofen for this. She complains of intermittent mild, throbbing and burning pain with a feeling of numbness. She states it is worse with activities. She has a history of CMC arthritis especially her right side. No history of diabetes, thyroid problems, or gout. She has no family history in that she is adopted.She has had her nerve conductions done revealing bilateral carpal tunnel syndrome with a motor delay of 6.1 on the left, 5.7 on the right, sensory delay of 4.6 on the left and 3.6 on the right with amplitude diminutions of 9 and 9.7 left and right.    PAST MEDICAL HISTORY: She is allergic to Bactrim. She is on Protonix, Bayer aspirin, magnesium, Motrin and nasal spray. Past surgery includes tubal ligation and nasal septoplasty.   FAMILY H ISTORY: Unknown.  SOCIAL HISTORY: She smokes 1 PPD and is advised to quit and the reasons behind this. She does not drink. She is widowed and a Art therapist.   REVIEW OF SYSTEMS: Positive for contacts, easy bruising, otherwise negative for 14 points. Paige Wilson is an 58 y.o. female.   Chief Complaint: Bilateral CTS HPI: see above  Past Medical History  Diagnosis Date  . Hyperlipidemia     borderline - no current med.  Marland Kitchen GERD (gastroesophageal reflux disease)   . Arthritis     thumbs, lower back  . Carpal tunnel syndrome on both sides 03/2014  . Dental crowns present     Past Surgical History  Procedure Laterality Date  . Tubal ligation  1997  . Nasal septum surgery  2012  . Tonsillectomy and adenoidectomy       Family History  Problem Relation Age of Onset  . Adopted: Yes   Social History:  reports that she has been smoking Cigarettes.  She has a 30 pack-year smoking history. She has never used smokeless tobacco. She reports that she does not drink alcohol or use illicit drugs.  Allergies:  Allergies  Allergen Reactions  . Red Dye Shortness Of Breath and Itching  . Adhesive [Tape] Rash  . Sulfamethoxazole-Trimethoprim Rash    Medications Prior to Admission  Medication Sig Dispense Refill  . aspirin 81 MG tablet Take 81 mg by mouth daily.        . calcium-vitamin D 250-100 MG-UNIT per tablet Take 1 tablet by mouth daily.        . cetirizine (ZYRTEC) 10 MG tablet Take 10 mg by mouth daily.      . fluticasone (VERAMYST) 27.5 MCG/SPRAY nasal spray Place 2 sprays into the nose daily.      Marland Kitchen ibuprofen (ADVIL,MOTRIN) 600 MG tablet Take 600 mg by mouth 4 (four) times daily.      . Magnesium 500 MG TABS Take 500 mg by mouth daily.      . montelukast (SINGULAIR) 10 MG tablet Take 1 tablet (10 mg total) by mouth at bedtime.  100 tablet  3  . pantoprazole (PROTONIX) 40 MG tablet TAKE 1 TABLET BY MOUTH DAILY  90  tablet  3    No results found for this or any previous visit (from the past 48 hour(s)).  No results found.   Pertinent items are noted in HPI.  Height 5\' 2"  (1.575 m), weight 68.04 kg (150 lb).  General appearance: alert, cooperative and appears stated age Head: Normocephalic, without obvious abnormality Neck: no carotid bruit and no JVD Resp: clear to auscultation bilaterally Cardio: regular rate and rhythm, S1, S2 normal, no murmur, click, rub or gallop GI: soft, non-tender; bowel sounds normal; no masses,  no organomegaly Extremities: extremities normal, atraumatic, no cyanosis or edema Pulses: 2+ and symmetric Skin: Skin color, texture, turgor normal. No rashes or lesions Neurologic: Grossly normal Incision/Wound: na  Assessment/Plan X-rays of her hand reveals  pantrapezial arthritis on her right side. She has minimal changes on the left at the [facility] joint.  Diagnosis: (1)Bilateralcarpal tunnel syndrome. (2) CMC arthritis.  She is advised with the discomfort in her neck there is always the potential it could be coming from her neck. We have recommended nerve conductions be performed at this time. She is aware there is no guarantee with surgery, possibility of infection, recurrence, injury to arteries, nerves and tendons, incomplete relief of symptoms and dystrophy. She would like to tentatively schedule for bilateral carpal tunnel releases   Wynonia Sours 04/02/2014, 12:47 PM

## 2014-04-02 NOTE — Anesthesia Preprocedure Evaluation (Addendum)
Anesthesia Evaluation  Patient identified by MRN, date of birth, ID band Patient awake    Reviewed: Allergy & Precautions, H&P , NPO status   Airway Mallampati: II TM Distance: >3 FB Neck ROM: Full    Dental  (+) Teeth Intact, Dental Advisory Given   Pulmonary Current Smoker,  breath sounds clear to auscultation        Cardiovascular Rhythm:Regular Rate:Normal     Neuro/Psych    GI/Hepatic   Endo/Other    Renal/GU      Musculoskeletal   Abdominal   Peds  Hematology   Anesthesia Other Findings   Reproductive/Obstetrics                          Anesthesia Physical Anesthesia Plan  ASA: I  Anesthesia Plan: General   Post-op Pain Management:    Induction: Intravenous  Airway Management Planned: LMA  Additional Equipment:   Intra-op Plan:   Post-operative Plan:   Informed Consent: I have reviewed the patients History and Physical, chart, labs and discussed the procedure including the risks, benefits and alternatives for the proposed anesthesia with the patient or authorized representative who has indicated his/her understanding and acceptance.   Dental advisory given  Plan Discussed with: CRNA and Anesthesiologist  Anesthesia Plan Comments:         Anesthesia Quick Evaluation

## 2014-04-02 NOTE — Anesthesia Procedure Notes (Signed)
Procedure Name: LMA Insertion Date/Time: 04/02/2014 2:28 PM Performed by: Lyndee Leo Pre-anesthesia Checklist: Patient identified, Emergency Drugs available, Suction available and Patient being monitored Patient Re-evaluated:Patient Re-evaluated prior to inductionOxygen Delivery Method: Circle System Utilized Preoxygenation: Pre-oxygenation with 100% oxygen Intubation Type: IV induction Ventilation: Mask ventilation without difficulty LMA: LMA inserted LMA Size: 4.0 Number of attempts: 1 Airway Equipment and Method: bite block Placement Confirmation: positive ETCO2 Tube secured with: Tape Dental Injury: Teeth and Oropharynx as per pre-operative assessment

## 2014-04-02 NOTE — Transfer of Care (Signed)
Immediate Anesthesia Transfer of Care Note  Patient: Paige Wilson  Procedure(s) Performed: Procedure(s) with comments: BILATERAL CARPAL TUNNEL RELEASE (Bilateral) - Left carpal tunnel release first        Patient Location: PACU  Anesthesia Type:General  Level of Consciousness: awake, sedated and patient cooperative  Airway & Oxygen Therapy: Patient Spontanous Breathing and Patient connected to face mask oxygen  Post-op Assessment: Report given to PACU RN and Post -op Vital signs reviewed and stable  Post vital signs: Reviewed and stable  Complications: No apparent anesthesia complications

## 2014-04-02 NOTE — Discharge Instructions (Addendum)

## 2014-04-02 NOTE — Brief Op Note (Signed)
04/02/2014  3:14 PM  PATIENT:  Paige Wilson  58 y.o. female  PRE-OPERATIVE DIAGNOSIS:  Bilateral Carpal Tunnel Syndrome  POST-OPERATIVE DIAGNOSIS:  Bilateral Carpal Tunnel Syndrome  PROCEDURE:  Procedure(s) with comments: BILATERAL CARPAL TUNNEL RELEASE (Bilateral) - Left carpal tunnel release first        SURGEON:  Surgeon(s) and Role:    * Wynonia Sours, MD - Primary  PHYSICIAN ASSISTANT:   ASSISTANTS: none   ANESTHESIA:   local and general  EBL:     BLOOD ADMINISTERED:none  DRAINS: none   LOCAL MEDICATIONS USED:  BUPIVICAINE   SPECIMEN:  No Specimen  DISPOSITION OF SPECIMEN:  N/A  COUNTS:  YES  TOURNIQUET:  * Missing tourniquet times found for documented tourniquets in log:  [id number] * Total Tourniquet Time Documented: Upper Arm (Left) - 17 minutes Total: Upper Arm (Left) - 17 minutes  Upper Arm (Right) - 19 minutes Total: Upper Arm (Right) - 19 minutes   DICTATION: .Other Dictation: Dictation Number [id number]  PLAN OF CARE: Discharge to home after PACU  PATIENT DISPOSITION:  PACU - hemodynamically stable.

## 2014-04-03 NOTE — Op Note (Signed)
Paige Wilson, Paige Wilson NO.:  [id number]  MEDICAL RECORD NO.:  [id number]  LOCATION:                                 FACILITY:  PHYSICIAN:  Daryll Brod, M.D.            DATE OF BIRTH:  DATE OF PROCEDURE:  04/02/2014 DATE OF DISCHARGE:                              OPERATIVE REPORT   PREOPERATIVE DIAGNOSIS:  Bilateral carpal tunnel syndrome.  POSTOPERATIVE DIAGNOSIS:  Bilateral carpal tunnel syndrome.  OPERATION:  Bilateral median nerve release at the wrist.  SURGEON:  Daryll Brod, M.D.  ANESTHESIA:  General with local infiltration.  ANESTHESIOLOGIST:  Glynda Jaeger, M.D.  HISTORY:  The patient is a 58 year old female with a history of bilateral carpal tunnel syndrome.  Nerve conductions are positive.  She has elected to undergo surgical decompression of both sides.  Pre, peri, and postoperative courses have been discussed along with risks and complications.  She is aware that there is no guarantee with the surgery; possibility of infection; recurrence of injury to arteries, nerves, tendons; incomplete relief of symptoms; and dystrophy.  In the preoperative area, the patient was seen, the extremity was marked by both patient and surgeon, and antibiotic given.  PROCEDURE IN DETAIL:  The patient was brought to the operating room, where a general anesthetic was carried out without difficulty.  She was prepped using ChloraPrep, supine position with the left arm free first. The limb was exsanguinated with an Esmarch bandage.  Tourniquet placed high on the arm was inflated to 250 mmHg.  A longitudinal incision was made in the left palm, carried down through the subcutaneous tissue. Bleeders were electrocauterized.  Palmar fascia was split.  Superficial palmar arch identified.  Flexor tendon of the ring and little finger identified to the ulnar side of median nerve.  The carpal retinaculum was incised with sharp dissection.  Right angle and Sewall retractor were  placed between the skin and forearm fascia.  Fascia was released for approximately 2 cm proximal to the wrist crease under direct vision. Canal was explored.  Air compression to the nerve was apparent.  No further lesions were identified.  The wound was irrigated.  Motor branch entered in the muscle.  The wound was irrigated and closed with a subcuticular 4-0 Vicryl Rapide sutures.  This was then Dermabonded.  A local infiltration with 0.25% Marcaine without epinephrine was given, 5 mL was used.  The right side was then prepped and draped.  The tourniquet was deflated on the left side.  All fingers were immediately pinked.  Time-out was taken, confirming the patient and procedure.  The limb exsanguinated with an Esmarch bandage.  Tourniquet placed high on the arm was inflated to 250 mmHg.  A longitudinal incision was made in the right palm, carried down through the subcutaneous tissue.  Bleeders were again electrocauterized with bipolar.  The palmar fascia was split. Superficial palmar arch identified.  Flexor tendon of the ring and little finger was identified to the ulnar side of the median nerve.  The carpal retinaculum was incised with sharp dissection.  Right angle and Sewall retractor were placed between the skin and forearm  fascia.  The fascia was released for approximately 2 cm proximal to the wrist crease under direct vision.  Canal was explored.  Area of compression to the nerve was again apparent.  Tenosynovial tissue was mildly thickened, motor branch entered in the muscle.  The wound was irrigated.  The skin was then closed with a subcuticular 4-0 Vicryl Rapide sutures and Dermabonded, closed.  A local infiltration with 0.25% Marcaine without epinephrine given, 5 mL was used on the right side.  A sterile compressive dressing with the fingers free.  On deflation of the tourniquet, all fingers were immediately pinked.  She was taken to the recovery room for observation in  satisfactory condition.  She will be discharged to home to return to the [place] [facility] in 1 week, on Vicodin.          ______________________________ Daryll Brod, M.D.     GK/MEDQ  D:  04/02/2014  T:  04/03/2014  Job:  [id number]

## 2014-04-06 ENCOUNTER — Encounter (HOSPITAL_BASED_OUTPATIENT_CLINIC_OR_DEPARTMENT_OTHER): Payer: Self-pay | Admitting: Orthopedic Surgery

## 2015-02-27 ENCOUNTER — Other Ambulatory Visit: Payer: Self-pay | Admitting: Family Medicine

## 2015-04-28 ENCOUNTER — Other Ambulatory Visit: Payer: Self-pay | Admitting: Family Medicine

## 2015-05-27 ENCOUNTER — Other Ambulatory Visit: Payer: Self-pay | Admitting: Family Medicine

## 2015-10-25 ENCOUNTER — Other Ambulatory Visit: Payer: Self-pay | Admitting: Family Medicine

## 2016-11-06 ENCOUNTER — Ambulatory Visit (INDEPENDENT_AMBULATORY_CARE_PROVIDER_SITE_OTHER): Payer: 59

## 2016-11-06 DIAGNOSIS — R002 Palpitations: Secondary | ICD-10-CM

## 2016-12-17 DIAGNOSIS — Z720 Tobacco use: Secondary | ICD-10-CM | POA: Diagnosis not present

## 2016-12-17 DIAGNOSIS — K219 Gastro-esophageal reflux disease without esophagitis: Secondary | ICD-10-CM | POA: Diagnosis not present

## 2016-12-17 DIAGNOSIS — E7800 Pure hypercholesterolemia, unspecified: Secondary | ICD-10-CM | POA: Diagnosis not present

## 2017-06-07 DIAGNOSIS — K219 Gastro-esophageal reflux disease without esophagitis: Secondary | ICD-10-CM | POA: Diagnosis not present

## 2017-06-07 DIAGNOSIS — E7800 Pure hypercholesterolemia, unspecified: Secondary | ICD-10-CM | POA: Diagnosis not present

## 2017-06-07 DIAGNOSIS — Z0000 Encounter for general adult medical examination without abnormal findings: Secondary | ICD-10-CM | POA: Diagnosis not present

## 2017-06-07 DIAGNOSIS — J309 Allergic rhinitis, unspecified: Secondary | ICD-10-CM | POA: Diagnosis not present

## 2017-06-07 DIAGNOSIS — R197 Diarrhea, unspecified: Secondary | ICD-10-CM | POA: Diagnosis not present

## 2017-07-30 DIAGNOSIS — H5213 Myopia, bilateral: Secondary | ICD-10-CM | POA: Diagnosis not present

## 2017-07-30 DIAGNOSIS — H524 Presbyopia: Secondary | ICD-10-CM | POA: Diagnosis not present

## 2017-08-27 DIAGNOSIS — B349 Viral infection, unspecified: Secondary | ICD-10-CM | POA: Diagnosis not present

## 2017-12-27 DIAGNOSIS — E7800 Pure hypercholesterolemia, unspecified: Secondary | ICD-10-CM | POA: Diagnosis not present

## 2017-12-27 DIAGNOSIS — K219 Gastro-esophageal reflux disease without esophagitis: Secondary | ICD-10-CM | POA: Diagnosis not present

## 2018-06-03 DIAGNOSIS — K5904 Chronic idiopathic constipation: Secondary | ICD-10-CM | POA: Diagnosis not present

## 2018-06-03 DIAGNOSIS — R1032 Left lower quadrant pain: Secondary | ICD-10-CM | POA: Diagnosis not present

## 2018-06-03 DIAGNOSIS — Z1211 Encounter for screening for malignant neoplasm of colon: Secondary | ICD-10-CM | POA: Diagnosis not present

## 2018-06-03 DIAGNOSIS — Z1231 Encounter for screening mammogram for malignant neoplasm of breast: Secondary | ICD-10-CM | POA: Diagnosis not present

## 2018-06-12 DIAGNOSIS — Z01419 Encounter for gynecological examination (general) (routine) without abnormal findings: Secondary | ICD-10-CM | POA: Diagnosis not present

## 2018-06-12 DIAGNOSIS — Z6824 Body mass index (BMI) 24.0-24.9, adult: Secondary | ICD-10-CM | POA: Diagnosis not present

## 2018-06-12 DIAGNOSIS — M8580 Other specified disorders of bone density and structure, unspecified site: Secondary | ICD-10-CM | POA: Diagnosis not present

## 2018-07-16 DIAGNOSIS — E7800 Pure hypercholesterolemia, unspecified: Secondary | ICD-10-CM | POA: Diagnosis not present

## 2018-07-16 DIAGNOSIS — K219 Gastro-esophageal reflux disease without esophagitis: Secondary | ICD-10-CM | POA: Diagnosis not present

## 2018-07-16 DIAGNOSIS — Z23 Encounter for immunization: Secondary | ICD-10-CM | POA: Diagnosis not present

## 2018-07-16 DIAGNOSIS — Z0000 Encounter for general adult medical examination without abnormal findings: Secondary | ICD-10-CM | POA: Diagnosis not present

## 2018-07-17 DIAGNOSIS — M8589 Other specified disorders of bone density and structure, multiple sites: Secondary | ICD-10-CM | POA: Diagnosis not present

## 2018-07-28 DIAGNOSIS — D125 Benign neoplasm of sigmoid colon: Secondary | ICD-10-CM | POA: Diagnosis not present

## 2018-07-28 DIAGNOSIS — K635 Polyp of colon: Secondary | ICD-10-CM | POA: Diagnosis not present

## 2018-07-28 DIAGNOSIS — Z1211 Encounter for screening for malignant neoplasm of colon: Secondary | ICD-10-CM | POA: Diagnosis not present

## 2018-10-08 DIAGNOSIS — E559 Vitamin D deficiency, unspecified: Secondary | ICD-10-CM | POA: Diagnosis not present

## 2019-01-23 DIAGNOSIS — E7800 Pure hypercholesterolemia, unspecified: Secondary | ICD-10-CM | POA: Diagnosis not present

## 2019-01-23 DIAGNOSIS — K219 Gastro-esophageal reflux disease without esophagitis: Secondary | ICD-10-CM | POA: Diagnosis not present

## 2020-02-03 ENCOUNTER — Other Ambulatory Visit: Payer: Self-pay | Admitting: *Deleted

## 2020-02-03 DIAGNOSIS — F17210 Nicotine dependence, cigarettes, uncomplicated: Secondary | ICD-10-CM

## 2020-03-07 ENCOUNTER — Ambulatory Visit
Admission: RE | Admit: 2020-03-07 | Discharge: 2020-03-07 | Disposition: A | Discharge status: Home / Self Care | Payer: 59 | Source: Ambulatory Visit | Attending: Acute Care | Admitting: Acute Care

## 2020-03-07 ENCOUNTER — Ambulatory Visit (INDEPENDENT_AMBULATORY_CARE_PROVIDER_SITE_OTHER): Payer: 59 | Admitting: Acute Care

## 2020-03-07 ENCOUNTER — Other Ambulatory Visit: Payer: Self-pay

## 2020-03-07 ENCOUNTER — Encounter: Payer: Self-pay | Admitting: Acute Care

## 2020-03-07 DIAGNOSIS — F17210 Nicotine dependence, cigarettes, uncomplicated: Secondary | ICD-10-CM

## 2020-03-07 DIAGNOSIS — Z122 Encounter for screening for malignant neoplasm of respiratory organs: Secondary | ICD-10-CM

## 2020-03-07 DIAGNOSIS — Z7189 Other specified counseling: Secondary | ICD-10-CM

## 2020-03-07 DIAGNOSIS — Z716 Tobacco abuse counseling: Secondary | ICD-10-CM | POA: Diagnosis not present

## 2020-03-07 NOTE — Progress Notes (Addendum)
Shared Decision Making Visit Lung Cancer Screening Program [phone])   Eligibility:  Age 64 y.o.  Pack Years Smoking History Calculation 30 pack year smoking history (# packs/per year x # years smoked)  Recent History of coughing up blood  no  Unexplained weight loss? no ( >Than 15 pounds within the last 6 months )  Prior History Lung / other cancer no (Diagnosis within the last 5 years already requiring surveillance chest CT Scans).  Smoking Status Current Smoker  Former Smokers: Years since quit: NA  Quit Date: NA  Visit Components:  Discussion included one or more decision making aids. yes  Discussion included risk/benefits of screening. yes  Discussion included potential follow up diagnostic testing for abnormal scans. yes  Discussion included meaning and risk of over diagnosis. yes  Discussion included meaning and risk of False Positives. yes  Discussion included meaning of total radiation exposure. yes  Counseling Included:  Importance of adherence to annual lung cancer LDCT screening. yes  Impact of comorbidities on ability to participate in the program. yes  Ability and willingness to under diagnostic treatment. yes  Smoking Cessation Counseling:  Current Smokers:   Discussed importance of smoking cessation. yes  Information about tobacco cessation classes and interventions provided to patient. yes  Patient provided with "ticket" for LDCT Scan. yes  Symptomatic Patient. no  Counseling  Diagnosis Code: Tobacco Use Z72.0  Asymptomatic Patient yes  Counseling (Intermediate counseling: > three minutes counseling) [id number]  Former Smokers:   Discussed the importance of maintaining cigarette abstinence. yes  Diagnosis Code: Personal History of Nicotine Dependence. [id number]  Information about tobacco cessation classes and interventions provided to patient. Yes  Patient provided with "ticket" for LDCT Scan. yes  Written Order for Lung Cancer  Screening with LDCT placed in Epic. Yes (CT Chest Lung Cancer Screening Low Dose W/O CM) [id number] Z12.2-Screening of respiratory organs Z87.891-Personal history of nicotine dependence  I have spent 25 minutes of face to face time with Ms. Shedden discussing the risks and benefits of lung cancer screening. We viewed a power point together that explained in detail the above noted topics. We paused at intervals to allow for questions to be asked and answered to ensure understanding.We discussed that the single most powerful action that she can take to decrease her risk of developing lung cancer is to quit smoking. We discussed whether or not she is ready to commit to setting a quit date. We discussed options for tools to aid in quitting smoking including nicotine replacement therapy, non-nicotine medications, support groups, Quit Smart classes, and behavior modification. We discussed that often times setting smaller, more achievable goals, such as eliminating 1 cigarette a day for a week and then 2 cigarettes a day for a week can be helpful in slowly decreasing the number of cigarettes smoked. This allows for a sense of accomplishment as well as providing a clinical benefit. I gave her the " Be Stronger Than Your Excuses" card with contact information for community resources, classes, free nicotine replacement therapy, and access to mobile apps, text messaging, and on-line smoking cessation help. I have also given her my card and contact information in the event she needs to contact me. We discussed the time and location of the scan, and that either Doroteo Glassman RN or I will call with the results within 24-48 hours of receiving them. I have offered her  a copy of the power point we viewed  as a resource in the event they need reinforcement of  the concepts we discussed today in the office. The patient verbalized understanding of all of  the above and had no further questions upon leaving the office. They have my  contact information in the event they have any further questions.  I spent 4 minutes counseling on smoking cessation and the health risks of continued tobacco abuse.  I explained to the patient that there has been a high incidence of coronary artery disease noted on these exams. I explained that this is a non-gated exam therefore degree or severity cannot be determined. This patient is  not on statin therapy. I have asked the patient to follow-up with their PCP regarding any incidental finding of coronary artery disease and management with diet or medication as their PCP  feels is clinically indicated. The patient verbalized understanding of the above and had no further questions upon completion of the visit.   Pt. Is contemplating quitting. She is interested in trying the reduce to quit method. We discussed this method in full, including obtaining free nicotine patches gum and mint at 1-800-Quit Now. She knows to call the office for any help with smoking cessation. She has the office contact numbers.   Magdalen Spatz, NP 03/07/2020

## 2020-03-07 NOTE — Patient Instructions (Signed)
Thank you for participating in the [facility]. It was our pleasure to meet you today. We will call you with the results of your scan within the next few days. Your scan will be assigned a Lung RADS category score by the physicians reading the scans.  This Lung RADS score determines follow up scanning.  See below for description of categories, and follow up screening recommendations. We will be in touch to schedule your follow up screening annually or based on recommendations of our providers. We will fax a copy of your scan results to your Primary Care Physician, or the physician who referred you to the program, to ensure they have the results. Please call the office if you have any questions or concerns regarding your scanning experience or results.  Our office number is [phone]. Please speak with Denise Phelps, RN. She is our Lung Cancer Screening RN. If she is unavailable when you call, please have the office staff send her a message. She will return your call at her earliest convenience. Remember, if your scan is normal, we will scan you annually as long as you continue to meet the criteria for the program. (Age 55-77, Current smoker or smoker who has quit within the last 15 years). If you are a smoker, remember, quitting is the single most powerful action that you can take to decrease your risk of lung cancer and other pulmonary, breathing related problems. We know quitting is hard, and we are here to help.  Please let us know if there is anything we can do to help you meet your goal of quitting. If you are a former smoker, congratulations. We are proud of you! Remain smoke free! Remember you can refer friends or family members through the number above.  We will screen them to make sure they meet criteria for the program. Thank you for helping us take better care of you by participating in Lung Screening.  Lung RADS Categories:  Lung RADS 1: no nodules  or definitely non-concerning nodules.  Recommendation is for a repeat annual scan in 12 months.  Lung RADS 2:  nodules that are non-concerning in appearance and behavior with a very low likelihood of becoming an active cancer. Recommendation is for a repeat annual scan in 12 months.  Lung RADS 3: nodules that are probably non-concerning , includes nodules with a low likelihood of becoming an active cancer.  Recommendation is for a 6-month repeat screening scan. Often noted after an upper respiratory illness. We will be in touch to make sure you have no questions, and to schedule your 6-month scan.  Lung RADS 4 A: nodules with concerning findings, recommendation is most often for a follow up scan in 3 months or additional testing based on our provider's assessment of the scan. We will be in touch to make sure you have no questions and to schedule the recommended 3 month follow up scan.  Lung RADS 4 B:  indicates findings that are concerning. We will be in touch with you to schedule additional diagnostic testing based on our provider's  assessment of the scan.   

## 2020-03-10 NOTE — Progress Notes (Signed)

## 2020-03-15 ENCOUNTER — Other Ambulatory Visit: Payer: Self-pay | Admitting: *Deleted

## 2020-03-15 DIAGNOSIS — F17210 Nicotine dependence, cigarettes, uncomplicated: Secondary | ICD-10-CM

## 2021-04-11 ENCOUNTER — Other Ambulatory Visit: Payer: Self-pay | Admitting: *Deleted

## 2021-04-11 ENCOUNTER — Telehealth: Payer: Self-pay | Admitting: Acute Care

## 2021-04-11 DIAGNOSIS — F17210 Nicotine dependence, cigarettes, uncomplicated: Secondary | ICD-10-CM

## 2021-04-20 NOTE — Telephone Encounter (Signed)
Nothing noted in message. Will close encounter.  

## 2021-04-28 ENCOUNTER — Other Ambulatory Visit: Payer: Self-pay

## 2021-04-28 ENCOUNTER — Ambulatory Visit
Admission: RE | Admit: 2021-04-28 | Discharge: 2021-04-28 | Disposition: A | Discharge status: Home / Self Care | Payer: 59 | Source: Ambulatory Visit | Attending: Acute Care | Admitting: Acute Care

## 2021-04-28 DIAGNOSIS — F17210 Nicotine dependence, cigarettes, uncomplicated: Secondary | ICD-10-CM

## 2021-05-09 NOTE — Progress Notes (Signed)
Please call patient and let them  know their  low dose Ct was read as a Lung RADS 2: nodules that are benign in appearance and behavior with a very low likelihood of becoming a clinically active cancer due to size or lack of growth. Recommendation per radiology is for a repeat LDCT in 12 months. .Please let them  know we will order and schedule their  annual screening scan for 04/2022. Please let them  know there was notation of CAD on their  scan.  Please remind the patient  that this is a non-gated exam therefore degree or severity of disease  cannot be determined. Please have them  follow up with their PCP regarding potential risk factor modification, dietary therapy or pharmacologic therapy if clinically indicated. Pt.  is  currently on statin therapy. Please place order for annual  screening scan for  04/2022 and fax results to PCP. Thanks so much. 

## 2021-05-16 ENCOUNTER — Other Ambulatory Visit: Payer: Self-pay | Admitting: *Deleted

## 2021-05-16 DIAGNOSIS — F17210 Nicotine dependence, cigarettes, uncomplicated: Secondary | ICD-10-CM

## 2022-04-30 ENCOUNTER — Ambulatory Visit
Admission: RE | Admit: 2022-04-30 | Discharge: 2022-04-30 | Disposition: A | Discharge status: Home / Self Care | Payer: 59 | Source: Ambulatory Visit

## 2022-04-30 DIAGNOSIS — F17210 Nicotine dependence, cigarettes, uncomplicated: Secondary | ICD-10-CM

## 2022-05-01 ENCOUNTER — Other Ambulatory Visit: Payer: Self-pay | Admitting: Acute Care

## 2022-05-01 DIAGNOSIS — Z122 Encounter for screening for malignant neoplasm of respiratory organs: Secondary | ICD-10-CM

## 2022-05-01 DIAGNOSIS — Z87891 Personal history of nicotine dependence: Secondary | ICD-10-CM

## 2022-05-01 DIAGNOSIS — F17210 Nicotine dependence, cigarettes, uncomplicated: Secondary | ICD-10-CM

## 2023-04-08 ENCOUNTER — Other Ambulatory Visit: Payer: Self-pay | Admitting: Acute Care

## 2023-04-08 DIAGNOSIS — F17210 Nicotine dependence, cigarettes, uncomplicated: Secondary | ICD-10-CM

## 2023-04-08 DIAGNOSIS — Z87891 Personal history of nicotine dependence: Secondary | ICD-10-CM

## 2023-04-08 DIAGNOSIS — Z122 Encounter for screening for malignant neoplasm of respiratory organs: Secondary | ICD-10-CM

## 2023-05-07 ENCOUNTER — Ambulatory Visit
Admission: RE | Admit: 2023-05-07 | Discharge: 2023-05-07 | Disposition: A | Discharge status: Home / Self Care | Payer: Medicare Other | Source: Ambulatory Visit | Attending: Acute Care | Admitting: Acute Care

## 2023-05-07 DIAGNOSIS — F17210 Nicotine dependence, cigarettes, uncomplicated: Secondary | ICD-10-CM

## 2023-05-07 DIAGNOSIS — Z122 Encounter for screening for malignant neoplasm of respiratory organs: Secondary | ICD-10-CM

## 2023-05-07 DIAGNOSIS — Z87891 Personal history of nicotine dependence: Secondary | ICD-10-CM

## 2023-05-13 ENCOUNTER — Other Ambulatory Visit: Payer: Self-pay | Admitting: Acute Care

## 2023-05-13 DIAGNOSIS — F17210 Nicotine dependence, cigarettes, uncomplicated: Secondary | ICD-10-CM

## 2023-05-13 DIAGNOSIS — Z87891 Personal history of nicotine dependence: Secondary | ICD-10-CM

## 2023-05-13 DIAGNOSIS — Z122 Encounter for screening for malignant neoplasm of respiratory organs: Secondary | ICD-10-CM

## 2023-12-08 ENCOUNTER — Emergency Department (HOSPITAL_BASED_OUTPATIENT_CLINIC_OR_DEPARTMENT_OTHER)
Admission: EM | Admit: 2023-12-08 | Discharge: 2023-12-08 | Discharge status: Against Medical Advice | Payer: Medicare Other | Source: Home / Self Care

## 2023-12-08 ENCOUNTER — Other Ambulatory Visit: Payer: Self-pay

## 2023-12-30 IMAGING — CT CT CHEST LUNG CANCER SCREENING LOW DOSE W/O CM
1 series · 15 of 31 positions shown, 19 images · non-contrast
Comparison: 04/28/2021 screening chest CT.

CLINICAL DATA: 66-year-old asymptomatic female current smoker with
101 pack-year smoking history.



[Series 2: ldct screening <30 bmi · axial · 0.71mm/px · z∈[-428,-138]mm · 15 of 64 slices shown, 19 images]
[im 3/64  mediastinal]
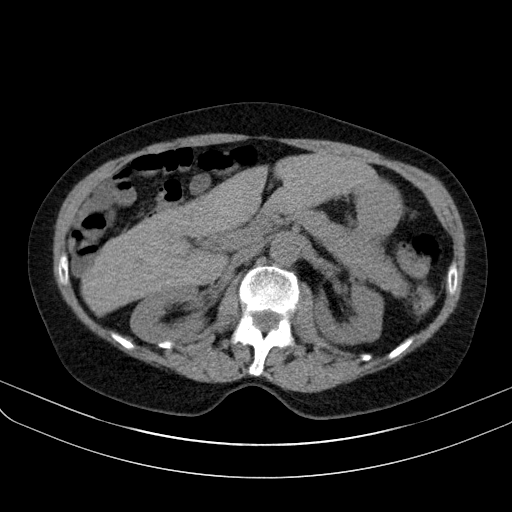
[im 3/64  lung]
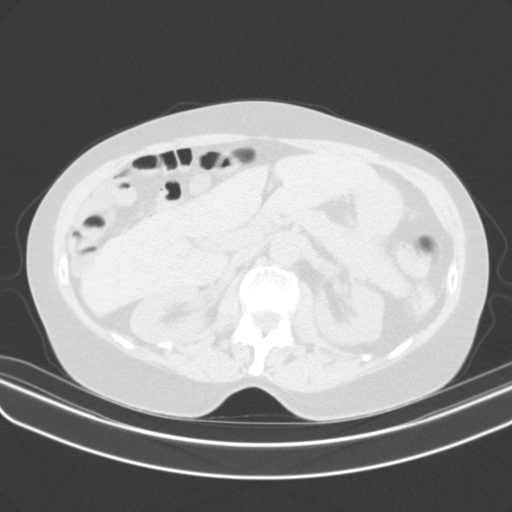
[im 8/64  lung]
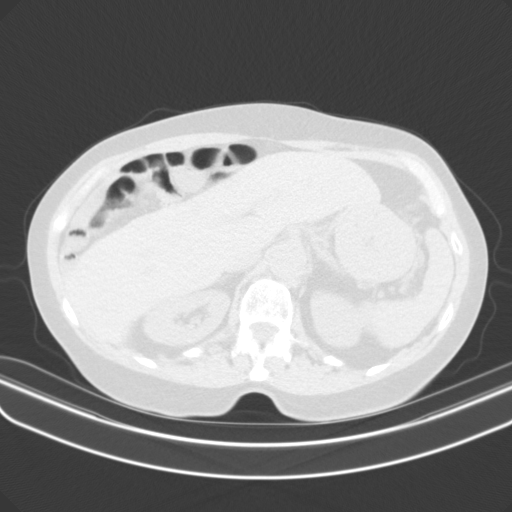
[im 12/64  lung]
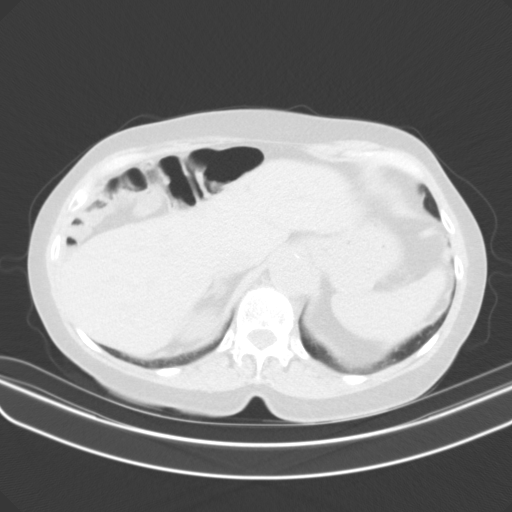
[im 15/64  lung]
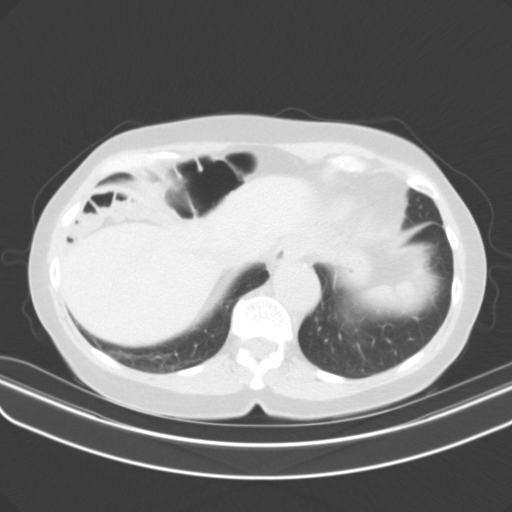
[im 19/64  mediastinal]
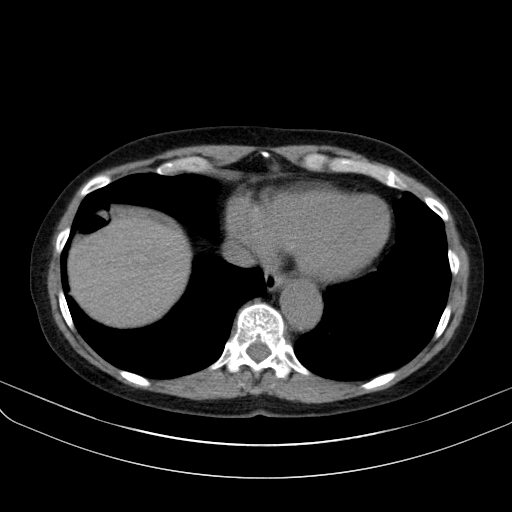
[im 19/64  lung]
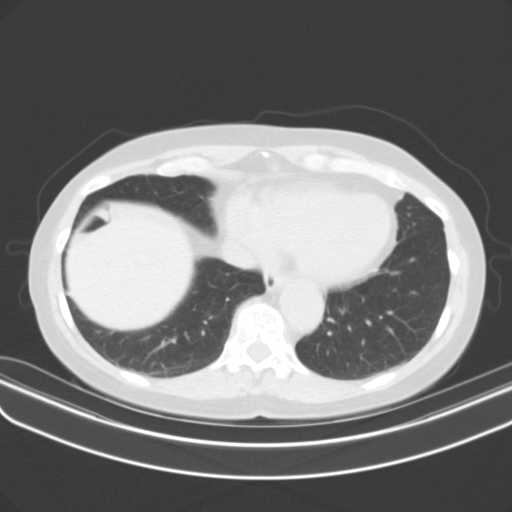
[im 24/64  lung]
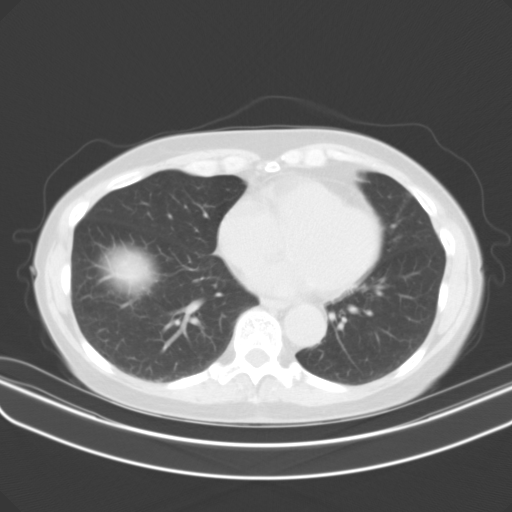
[im 29/64  lung]
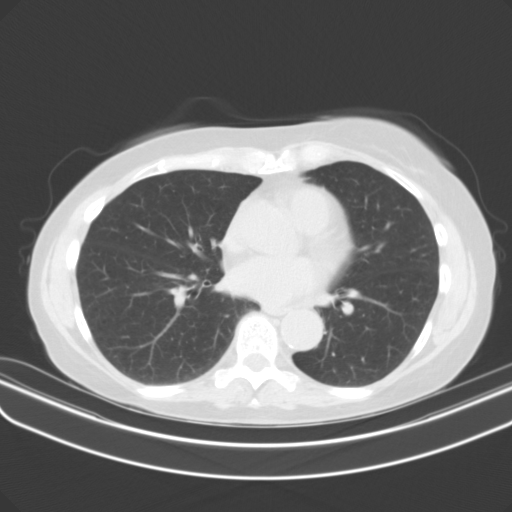
[im 33/64  lung]
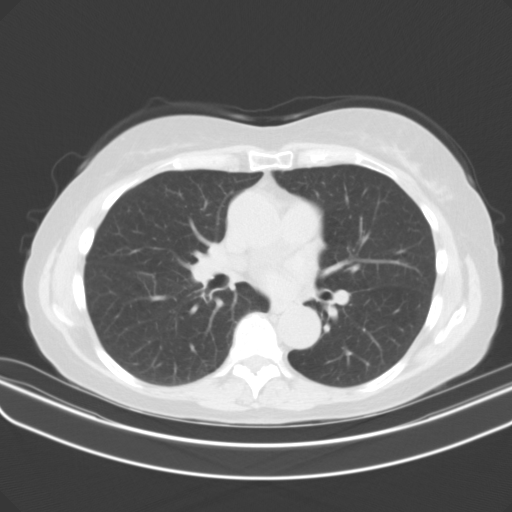
[im 36/64  mediastinal]
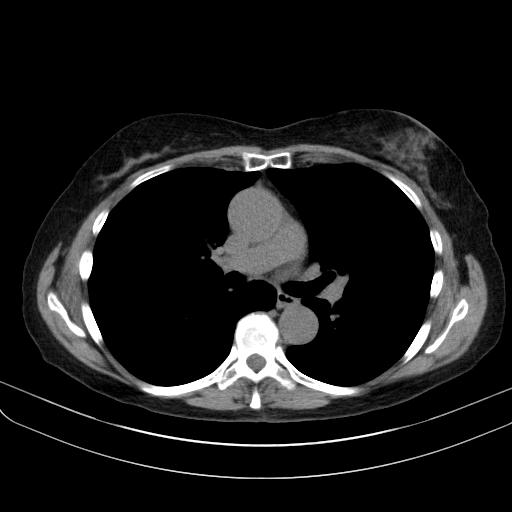
[im 36/64  lung]
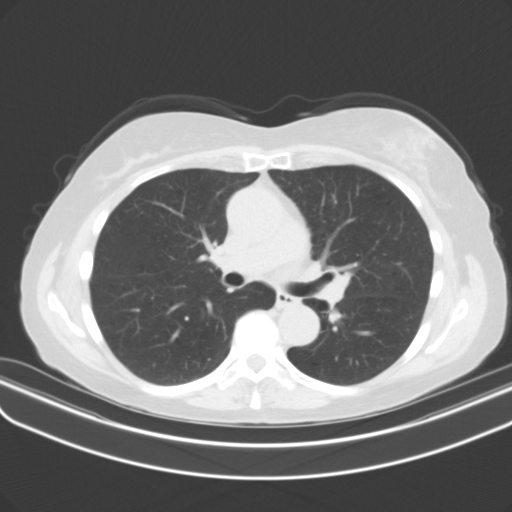
[im 40/64  lung]
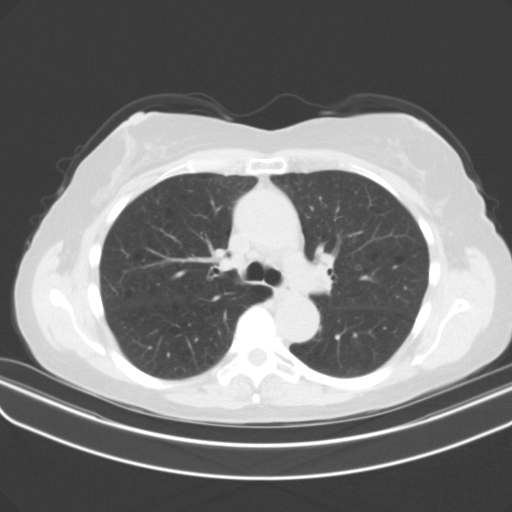
[im 45/64  lung]
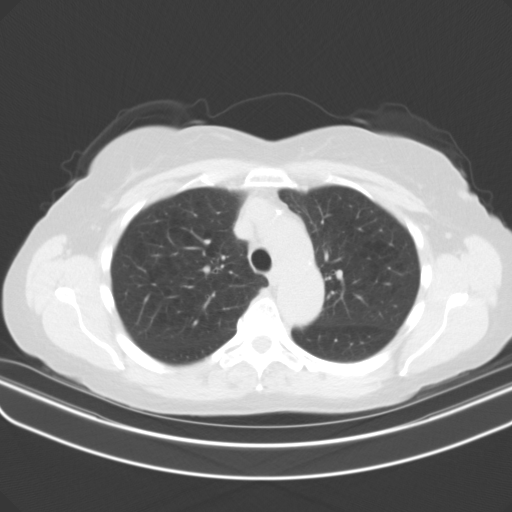
[im 50/64  lung]
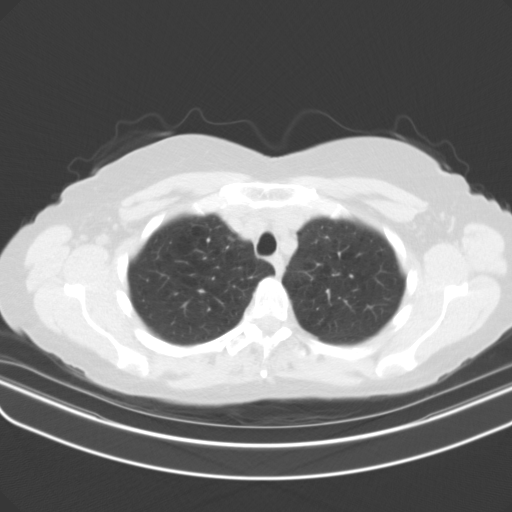
[im 52/64  mediastinal]
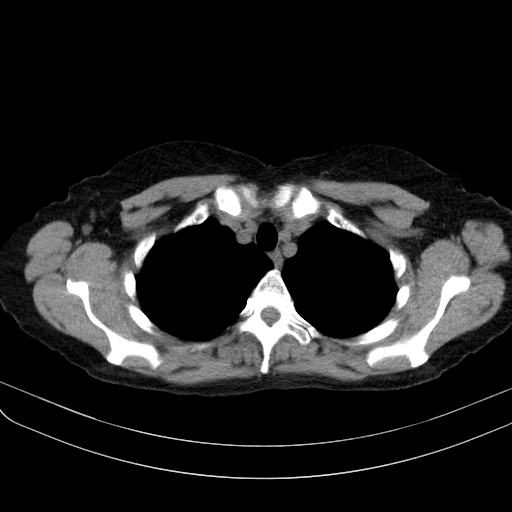
[im 52/64  lung]
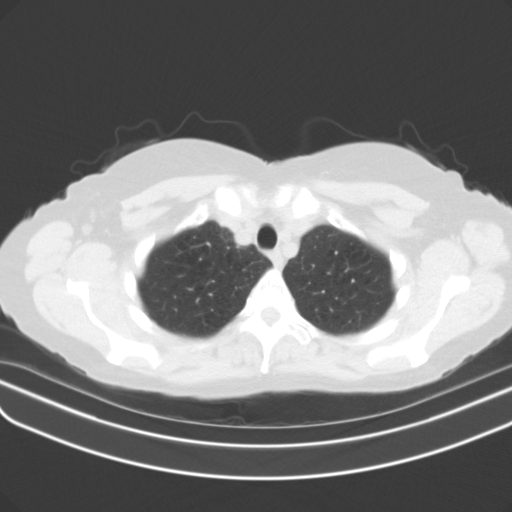
[im 57/64  lung]
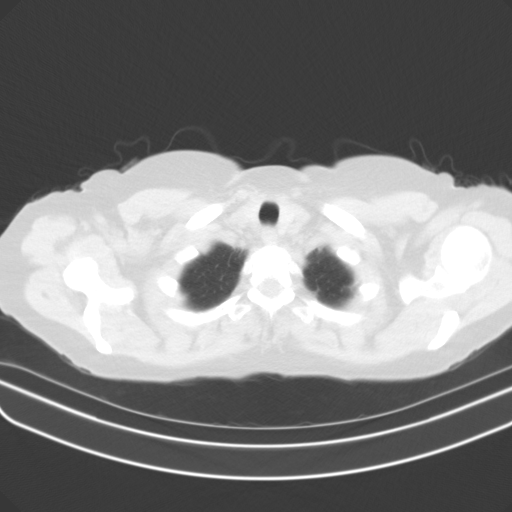
[im 61/64  lung]
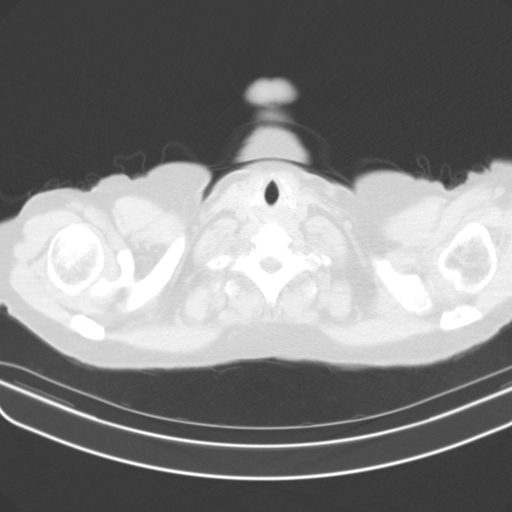

[15 of 31 positions shown; findings below may reference images not displayed]

FINDINGS: Cardiovascular: Normal heart size. No significant pericardial
effusion/thickening. Left anterior descending and left circumflex
coronary atherosclerosis. Atherosclerotic nonaneurysmal thoracic
aorta. Normal caliber pulmonary arteries.

Mediastinum/Nodes: No discrete thyroid nodules. Unremarkable
esophagus. No pathologically enlarged axillary, mediastinal or hilar
lymph nodes, noting limited sensitivity for the detection of hilar
adenopathy on this noncontrast study.

Lungs/Pleura: No pneumothorax. No pleural effusion. Moderate
centrilobular emphysema with diffuse bronchial wall thickening. No
acute consolidative airspace disease or lung masses. No significant
growth of previously visualized pulmonary nodules. No new
significant pulmonary nodules.

Upper abdomen: No acute abnormality.

Musculoskeletal: No aggressive appearing focal osseous lesions. Mild
thoracic spondylosis.
IMPRESSION: 1. Lung-RADS 2, benign appearance or behavior. Continue annual
screening with low-dose chest CT without contrast in 12 months.
2. Two-vessel coronary atherosclerosis.
3. Aortic Atherosclerosis (0HQ6J-DJK.K) and Emphysema (0HQ6J-WDB.9).

## 2024-05-06 ENCOUNTER — Other Ambulatory Visit: Payer: Self-pay | Admitting: Acute Care

## 2024-05-06 DIAGNOSIS — F17210 Nicotine dependence, cigarettes, uncomplicated: Secondary | ICD-10-CM

## 2024-05-06 DIAGNOSIS — Z122 Encounter for screening for malignant neoplasm of respiratory organs: Secondary | ICD-10-CM

## 2024-05-06 DIAGNOSIS — Z87891 Personal history of nicotine dependence: Secondary | ICD-10-CM

## 2024-05-08 ENCOUNTER — Other Ambulatory Visit

## 2024-05-13 ENCOUNTER — Inpatient Hospital Stay
Admission: RE | Admit: 2024-05-13 | Discharge: 2024-05-13 | Disposition: A | Discharge status: Home / Self Care | Source: Ambulatory Visit | Attending: Acute Care | Admitting: Acute Care

## 2024-05-13 DIAGNOSIS — F17210 Nicotine dependence, cigarettes, uncomplicated: Secondary | ICD-10-CM

## 2024-05-13 DIAGNOSIS — Z122 Encounter for screening for malignant neoplasm of respiratory organs: Secondary | ICD-10-CM

## 2024-05-13 DIAGNOSIS — Z87891 Personal history of nicotine dependence: Secondary | ICD-10-CM

## 2024-05-25 ENCOUNTER — Other Ambulatory Visit: Payer: Self-pay

## 2024-05-25 DIAGNOSIS — F17210 Nicotine dependence, cigarettes, uncomplicated: Secondary | ICD-10-CM

## 2024-05-25 DIAGNOSIS — Z122 Encounter for screening for malignant neoplasm of respiratory organs: Secondary | ICD-10-CM

## 2024-05-25 DIAGNOSIS — Z87891 Personal history of nicotine dependence: Secondary | ICD-10-CM
